# Patient Record
Sex: Female | Born: 1955 | Race: White | Hispanic: No | Marital: Single | Smoking: Former smoker
Health system: Southern US, Community
[De-identification: ages and names within clinical notes are randomized; demographics above are authoritative.]

## PROBLEM LIST (undated history)

## (undated) DIAGNOSIS — F329 Major depressive disorder, single episode, unspecified: Secondary | ICD-10-CM

## (undated) DIAGNOSIS — F419 Anxiety disorder, unspecified: Secondary | ICD-10-CM

## (undated) DIAGNOSIS — D649 Anemia, unspecified: Secondary | ICD-10-CM

## (undated) DIAGNOSIS — M47816 Spondylosis without myelopathy or radiculopathy, lumbar region: Secondary | ICD-10-CM

## (undated) DIAGNOSIS — G709 Myoneural disorder, unspecified: Secondary | ICD-10-CM

## (undated) DIAGNOSIS — E785 Hyperlipidemia, unspecified: Secondary | ICD-10-CM

## (undated) DIAGNOSIS — E7800 Pure hypercholesterolemia, unspecified: Secondary | ICD-10-CM

## (undated) DIAGNOSIS — I10 Essential (primary) hypertension: Secondary | ICD-10-CM

## (undated) DIAGNOSIS — F32A Depression, unspecified: Secondary | ICD-10-CM

## (undated) DIAGNOSIS — T7840XA Allergy, unspecified, initial encounter: Secondary | ICD-10-CM

## (undated) DIAGNOSIS — E119 Type 2 diabetes mellitus without complications: Secondary | ICD-10-CM

## (undated) HISTORY — DX: Anxiety disorder, unspecified: F41.9

## (undated) HISTORY — PX: BACK SURGERY: SHX140

## (undated) HISTORY — PX: TUBAL LIGATION: SHX77

## (undated) HISTORY — DX: Myoneural disorder, unspecified: G70.9

## (undated) HISTORY — DX: Anemia, unspecified: D64.9

## (undated) HISTORY — DX: Spondylosis without myelopathy or radiculopathy, lumbar region: M47.816

## (undated) HISTORY — DX: Hyperlipidemia, unspecified: E78.5

## (undated) HISTORY — DX: Allergy, unspecified, initial encounter: T78.40XA

## (undated) HISTORY — PX: HERNIA REPAIR: SHX51

---

## 1979-02-14 HISTORY — PX: OTHER SURGICAL HISTORY: SHX169

## 1979-02-14 HISTORY — PX: DILATION AND CURETTAGE OF UTERUS: SHX78

## 1985-02-13 HISTORY — PX: CHOLECYSTECTOMY: SHX55

## 1996-02-14 HISTORY — PX: BACK SURGERY: SHX140

## 1996-02-14 HISTORY — PX: SPINE SURGERY: SHX786

## 2000-06-10 ENCOUNTER — Emergency Department (HOSPITAL_COMMUNITY): Admission: EM | Admit: 2000-06-10 | Discharge: 2000-06-10 | Payer: Self-pay | Admitting: Internal Medicine

## 2000-06-10 ENCOUNTER — Encounter: Payer: Self-pay | Admitting: Internal Medicine

## 2000-10-19 ENCOUNTER — Encounter: Payer: Self-pay | Admitting: Family Medicine

## 2000-10-19 ENCOUNTER — Ambulatory Visit (HOSPITAL_COMMUNITY): Admission: RE | Admit: 2000-10-19 | Discharge: 2000-10-19 | Payer: Self-pay | Admitting: Family Medicine

## 2001-02-13 HISTORY — PX: HERNIA REPAIR: SHX51

## 2001-10-15 ENCOUNTER — Encounter: Admission: RE | Admit: 2001-10-15 | Discharge: 2002-01-13 | Payer: Self-pay | Admitting: Family Medicine

## 2003-02-14 HISTORY — PX: CHOLECYSTECTOMY: SHX55

## 2003-06-12 ENCOUNTER — Inpatient Hospital Stay (HOSPITAL_COMMUNITY): Admission: AD | Admit: 2003-06-12 | Discharge: 2003-06-17 | Payer: Self-pay | Admitting: Family Medicine

## 2003-06-12 ENCOUNTER — Ambulatory Visit (HOSPITAL_COMMUNITY): Admission: RE | Admit: 2003-06-12 | Discharge: 2003-06-12 | Payer: Self-pay | Admitting: Family Medicine

## 2003-11-08 ENCOUNTER — Emergency Department (HOSPITAL_COMMUNITY): Admission: EM | Admit: 2003-11-08 | Discharge: 2003-11-08 | Payer: Self-pay | Admitting: Emergency Medicine

## 2007-07-21 ENCOUNTER — Emergency Department (HOSPITAL_COMMUNITY): Admission: EM | Admit: 2007-07-21 | Discharge: 2007-07-21 | Payer: Self-pay | Admitting: Emergency Medicine

## 2010-03-05 ENCOUNTER — Encounter: Payer: Self-pay | Admitting: Family Medicine

## 2010-03-06 ENCOUNTER — Encounter: Payer: Self-pay | Admitting: Family Medicine

## 2010-07-01 NOTE — Op Note (Signed)
NAME:  Sabrina Thompson, Sabrina Thompson                          ACCOUNT NO.:  [id number]   MEDICAL RECORD NO.:  [id number]                   PATIENT TYPE:  INP   LOCATION:  A318                                 FACILITY:  [facility]   PHYSICIAN:  Dalia Heading, M.D.               DATE OF BIRTH:  [birth year]   DATE OF PROCEDURE:  DATE OF DISCHARGE:                                 OPERATIVE REPORT   PREOPERATIVE DIAGNOSIS:  Acute cholecystitis.   POSTOPERATIVE DIAGNOSIS:  Acute cholecystitis, cholelithiasis.   PROCEDURE:  Laparoscopic cholecystectomy.   SURGEON:  Dalia Heading, M.D.   ASSISTANT:  Bernerd Limbo. Leona Carry, M.D.   ANESTHESIA:  General endotracheal.   INDICATIONS FOR PROCEDURE:  The patient is a 55 year old white female who  presents with acute cholecystitis as diagnosed by CT scan of the abdomen and  pelvis.  The risks and benefits of the procedure, including bleeding,  infection, hepatobiliary injury, and the possibility of an open procedure  were fully explained to the patient who gave informed consent.   DESCRIPTION OF PROCEDURE:  The patient was placed in the supine position.  After induction of general endotracheal anesthesia, the abdomen was prepped  and draped using the usual sterile technique with Betadine.  Surgical site  confirmation was performed.   A supraumbilical incision was made down to the fascia.  A Veress needle was  introduced into the abdominal cavity, and confirmation of placement was done  using the saline drop test.  The abdomen was then insufflated to 16 mmHg  pressure.  An 11-mm trocar was introduced into the abdominal cavity under  direct visualization without difficulty.  An additional 11-mm trocar was  placed in the epigastric region and 5-mm trocars were placed in the left  upper quadrant, right upper quadrant, and right flank regions.  The liver  was inspected and noted to be within normal limits.  The gallbladder was  noted to be acutely inflamed and  swollen.  Needle decompression of the  gallbladder was performed.  The gallbladder was then retracted superiorly  and laterally.  The dissection was begun around the infundibulum of the  gallbladder.  The cystic duct was first identified.  Its juncture to the  infundibulum was fully identified.  Endoclips were placed proximally and  distally on the cystic duct, and the cystic duct was divided.  This was  likewise done on the cystic artery.  The gallbladder was then freed away  from the gallbladder fossa using Bovie electrocautery.  The gallbladder was  delivered through the epigastric trocar site using an EndoCatch bag.  The  epigastric trocar site did have to be extended to allow removal of the  gallbladder which was noted to have two large stones.  The gallbladder fossa  was inspected, and bleeding was controlled using Bovie electrocautery.  The  subhepatic space was then irrigated with normal saline.  All fluid and air  were then evacuated from the abdominal cavity prior to removal of the  trocars.  Of note was the fact that Surgicel was placed in the gallbladder  fossa.   All tape and needle counts were correct at the end of the procedure.  The  supraumbilical as well as epigastric fascia were reapproximated using an 0  Vicryl interrupted suture.  All skin incisions were closed using staples.  Betadine ointment and dry sterile dressings were applied.   The patient was extubated in the operating room and went back to the  recovery room awake and in stable condition.   COMPLICATIONS:  None.   SPECIMENS:  Gallbladder with stones.   ESTIMATED BLOOD LOSS:  Less than 100 cc.      ___________________________________________                                            Dalia Heading, M.D.   MAJ/MEDQ  D:  06/15/2003  T:  06/15/2003  Job:  [id number]   cc:   Kirk Ruths, M.D.  [address]  [place]  [place] [address]  Fax: [phone]

## 2010-07-01 NOTE — Discharge Summary (Signed)
NAME:  Sabrina Thompson, Sabrina Thompson NO.:  [id number]   MEDICAL RECORD NO.:  [id number]                   PATIENT TYPE:  INP   LOCATION:  A318                                 FACILITY:  [facility]   PHYSICIAN:  Dalia Heading, M.D.               DATE OF BIRTH:  13-May-1955   DATE OF ADMISSION:  06/12/2003  DATE OF DISCHARGE:  06/17/2003                                 DISCHARGE SUMMARY   HOSPITAL COURSE SUMMARY:  The patient is a 55 year old white female who  presented from Dr. Dionne Ano. McGough's office with right upper-quadrant  abdominal pain secondary to acute cholecystitis.  She was admitted to the  hospital for intravenous hydration, pain control, glucose control, and  intravenous antibiotics.  A surgical consultation was obtained.  The patient  was subsequently taken to the operating room and underwent laparoscopic  cholecystectomy on Jun 15, 2003.  She was found to have acute cholecystitis  secondary to cholelithiasis.  She tolerated the procedure well.  Her  postoperative course has been for the most part unremarkable.  Her white  blood cell count has returned as normal.  Her diet was advanced without  difficulty.   The patient is being discharged home on Jun 17, 2003, in good, improved  condition.   DISCHARGE INSTRUCTIONS:  The patient is to follow up with Dr. Dalia Heading on Jun 23, 2003.   DISCHARGE MEDICATIONS:  Tylenol or Motrin as needed for pain.   PRINCIPAL DIAGNOSES:  1. Acute cholecystitis, cholelithiasis.  2. Noninsulin dependent diabetes mellitus.  3. Remote history of hypertension.   PRINCIPAL PROCEDURE:  Laparoscopic cholecystectomy on Jun 15, 2003.     ___________________________________________                                         Dalia Heading, M.D.   MAJ/MEDQ  D:  06/17/2003  T:  06/18/2003  Job:  [id number]   cc:   Dalia Heading, M.D.  [address] [place]  [place]  [place] [address]  Fax: [phone]   Kirk Ruths,  M.D.  [address]  [place]  [place] [address]  Fax: [phone]

## 2010-07-01 NOTE — H&P (Signed)
NAME:  Sabrina Thompson, Sabrina Thompson NO.:  [id number]   MEDICAL RECORD NO.:  [id number]                   PATIENT TYPE:  INP   LOCATION:  A318                                 FACILITY:  [facility]   PHYSICIAN:  Kirk Ruths, M.D.            DATE OF BIRTH:  [birth year]   DATE OF ADMISSION:  06/12/2003  DATE OF DISCHARGE:                                HISTORY & PHYSICAL   CHIEF COMPLAINT:  Stomach pain with nausea and vomiting.   HISTORY OF PRESENT ILLNESS:  This is a 55 year old, obese, white female who  presents to the office with a three-day history of stomach feeling like it  was on fire with gas, belching, nausea, and vomiting.  She also states that  has not had a bowel movement in approximately a week.  Exam at the office  showed tender right upper and lower quadrants with hypoactive bowel sounds.  The patient appeared miserable.  She was treated with some IM Phenergan and  a stat CT of the abdomen and pelvis was ordered and showed acute  cholecystitis.  The patient was admitted for IV antibiotics, pain and nausea  control, and a surgical consult.   ALLERGIES:  She is allergic to PREDNISONE, although she can tolerate Depo-  Medrol.  ACE INHIBITORS give her cough.   MEDICATIONS:  Although she is not on any medicines at this time due to  financial restraints, her usual medications are:  1. Norvasc 10 mg daily.  2. Diovan 80 mg.  3. Glucovance 2.5/500 mg.   PAST MEDICAL HISTORY:  1. Hypertension.  2. Diabetes.   REVIEW OF SYSTEMS:  Denies chest pains.   PHYSICAL EXAMINATION:  GENERAL APPEARANCE:  An obese white female who  appears miserable.  VITAL SIGNS:  The temperature is 99.8 degrees.  She had had Tylenol  approximately three hours before.  The pulse is 74 and regular.  The blood  pressure is 160/90.  Respirations are 20 and unlabored.  HEENT:  TMs are normal.  Pupils equal and reactive to light and  accommodation.  Oropharynx benign.  NECK:  Supple  without JVD, bruit, or thyromegaly.  LUNGS:  Clear in all areas.  HEART:  Regular sinus rhythm without murmur, rub, or gallop.  ABDOMEN:  Pendulous with diminished bowel sounds.  She has tenderness in the  right upper and lower quadrants with suggestion rebound.  EXTREMITIES:  Without clubbing or cyanosis.  There is a trace of edema.  NEUROLOGIC:  Examination is grossly intact.   ASSESSMENT:  1. Acute cholecystitis.  2. Hypertension, noncompliant.  3. Type 2 diabetes, noncompliant.     ___________________________________________                                         Kirk Ruths, M.D.   WMM/MEDQ  D:  06/12/2003  T:  06/12/2003  Job:  [id number]

## 2010-07-01 NOTE — Consult Note (Signed)
NAME:  Sabrina Thompson, Sabrina Thompson                          ACCOUNT NO.:  [id number]   MEDICAL RECORD NO.:  [id number]                   PATIENT TYPE:  INP   LOCATION:  A318                                 FACILITY:  [facility]   PHYSICIAN:  Dalia Heading, M.D.               DATE OF BIRTH:  [birth year]   DATE OF CONSULTATION:  06/13/2003  DATE OF DISCHARGE:                                   CONSULTATION   REASON FOR CONSULTATION:  Acute cholecystitis.   HISTORY OF PRESENT ILLNESS:  This patient is a 55 year old white female who  presents with a several-day history of worsening right upper quadrant  abdominal pain.  She had a CT scan of the abdomen and pelvis done as an  outpatient which revealed acute cholecystitis.  She was admitted by Dr.  Regino Schultze for further evaluation and treatment.   PAST MEDICAL HISTORY:  Includes noninsulin-dependent diabetes mellitus for  which she has not been taking her medication.  There is also a remote  history of hypertension.   PAST SURGICAL HISTORY:  Back surgery, tubal ligation, D&C in the remote  past.   CURRENT MEDICATIONS:  She was taking none at the time of admission.   ALLERGIES:  Prednisone.   REVIEW OF SYSTEMS:  The patient does smoke.  She denies any recent  cardiopulmonary difficulties or bleeding disorders.   PHYSICAL EXAMINATION:  GENERAL:  The patient is a moderately overweight  white female in no acute distress.  VITAL SIGNS:  She is afebrile and vital  signs are stable.  ABDOMEN:  Soft with tenderness noted in the right upper quadrant to  palpation.  No hepatosplenomegaly, masses, or hernias are identified.   Liver profile is within normal limits.  White blood cell count was elevated  at 14,000.  MET-7 was remarkable for a blood sugar of 266, sodium 129.  BUN  and creatinine were within normal limits.   IMPRESSION:  1. Acute cholecystitis.  2. Uncontrolled diabetes mellitus.  3. Hypertension.  4. Hyponatremia.   PLAN:  The patient is to  continue her admission in the hospital for control  of her diabetes mellitus and for intravenous antibiotic therapy.  I agree  with Unasyn for control of her acute cholecystitis.  She has been  temporarily  scheduled for Monday, Jun 15, 2003, for laparoscopic cholecystectomy.  The  risks and benefits of the procedure, including bleeding, infection,  hepatobiliary injury, the possibility of an open procedure were fully  explained to the patient, she gave informed consent.      ___________________________________________                                            Dalia Heading, M.D.   MAJ/MEDQ  D:  06/13/2003  T:  06/13/2003  Job:  [id number]

## 2010-11-10 LAB — CBC
HCT: 39.4
Hemoglobin: 14
MCHC: 35.6
MCV: 85.7
RBC: 4.6

## 2010-11-10 LAB — BASIC METABOLIC PANEL
CO2: 27
Chloride: 101
Creatinine, Ser: 0.57
GFR calc Af Amer: >60
Potassium: 3.9

## 2010-11-10 LAB — DIFFERENTIAL
Basophils Relative: 1
Eosinophils Absolute: 0.1
Eosinophils Relative: 1
Monocytes Absolute: 0.4
Monocytes Relative: 5
Neutrophils Relative %: 71

## 2010-11-10 LAB — URINALYSIS, ROUTINE W REFLEX MICROSCOPIC
Bilirubin Urine: NEGATIVE
Glucose, UA: >1000 — AB
Hgb urine dipstick: NEGATIVE
Protein, ur: NEGATIVE
Urobilinogen, UA: 0.2

## 2011-09-18 ENCOUNTER — Encounter (HOSPITAL_COMMUNITY): Payer: Self-pay | Admitting: *Deleted

## 2011-09-18 ENCOUNTER — Emergency Department (HOSPITAL_COMMUNITY)
Admission: EM | Admit: 2011-09-18 | Discharge: 2011-09-18 | Disposition: A | Discharge status: Home / Self Care | Payer: PRIVATE HEALTH INSURANCE | Attending: Emergency Medicine | Admitting: Emergency Medicine

## 2011-09-18 ENCOUNTER — Emergency Department (HOSPITAL_COMMUNITY): Payer: PRIVATE HEALTH INSURANCE

## 2011-09-18 DIAGNOSIS — F3289 Other specified depressive episodes: Secondary | ICD-10-CM | POA: Insufficient documentation

## 2011-09-18 DIAGNOSIS — E119 Type 2 diabetes mellitus without complications: Secondary | ICD-10-CM | POA: Insufficient documentation

## 2011-09-18 DIAGNOSIS — I10 Essential (primary) hypertension: Secondary | ICD-10-CM | POA: Insufficient documentation

## 2011-09-18 DIAGNOSIS — K436 Other and unspecified ventral hernia with obstruction, without gangrene: Secondary | ICD-10-CM | POA: Insufficient documentation

## 2011-09-18 DIAGNOSIS — R1033 Periumbilical pain: Secondary | ICD-10-CM | POA: Insufficient documentation

## 2011-09-18 DIAGNOSIS — Z79899 Other long term (current) drug therapy: Secondary | ICD-10-CM | POA: Insufficient documentation

## 2011-09-18 DIAGNOSIS — F329 Major depressive disorder, single episode, unspecified: Secondary | ICD-10-CM | POA: Insufficient documentation

## 2011-09-18 HISTORY — DX: Depression, unspecified: F32.A

## 2011-09-18 HISTORY — DX: Essential (primary) hypertension: I10

## 2011-09-18 HISTORY — DX: Major depressive disorder, single episode, unspecified: F32.9

## 2011-09-18 LAB — CBC WITH DIFFERENTIAL/PLATELET
Eosinophils Absolute: 0 10*3/uL (ref 0.0–0.7)
Eosinophils Relative: 0 % (ref 0–5)
HCT: 37.2 % (ref 36.0–46.0)
Lymphocytes Relative: 22 % (ref 12–46)
Lymphs Abs: 1.6 10*3/uL (ref 0.7–4.0)
MCH: 30.3 pg (ref 26.0–34.0)
MCV: 88.8 fL (ref 78.0–100.0)
Monocytes Absolute: 0.3 10*3/uL (ref 0.1–1.0)
Monocytes Relative: 5 % (ref 3–12)
Platelets: 303 10*3/uL (ref 150–400)
RBC: 4.19 MIL/uL (ref 3.87–5.11)
WBC: 7 10*3/uL (ref 4.0–10.5)

## 2011-09-18 LAB — COMPREHENSIVE METABOLIC PANEL
ALT: 28 U/L (ref 0–35)
Albumin: 3.8 g/dL (ref 3.5–5.2)
Alkaline Phosphatase: 68 U/L (ref 39–117)
BUN: 11 mg/dL (ref 6–23)
Chloride: 96 mEq/L (ref 96–112)
GFR calc Af Amer: >90 mL/min (ref >90)
Glucose, Bld: 176 mg/dL — ABNORMAL HIGH (ref 70–99)
Potassium: 3.6 mEq/L (ref 3.5–5.1)
Sodium: 134 mEq/L — ABNORMAL LOW (ref 135–145)
Total Bilirubin: 0.3 mg/dL (ref 0.3–1.2)

## 2011-09-18 LAB — LIPASE, BLOOD: Lipase: 22 U/L (ref 11–59)

## 2011-09-18 LAB — URINALYSIS, ROUTINE W REFLEX MICROSCOPIC
Bilirubin Urine: NEGATIVE
Hgb urine dipstick: NEGATIVE
Ketones, ur: NEGATIVE mg/dL
Nitrite: NEGATIVE
pH: 6 (ref 5.0–8.0)

## 2011-09-18 MED ORDER — ONDANSETRON HCL 4 MG/2ML IJ SOLN
4.0000 mg | Freq: Once | INTRAMUSCULAR | Status: AC
  Administered 2011-09-18: 4 mg via INTRAVENOUS
  Filled 2011-09-18 (×2): qty 2

## 2011-09-18 MED ORDER — HYDROMORPHONE HCL PF 1 MG/ML IJ SOLN
1.0000 mg | Freq: Once | INTRAMUSCULAR | Status: AC
  Administered 2011-09-18: 1 mg via INTRAVENOUS
  Filled 2011-09-18 (×2): qty 1

## 2011-09-18 MED ORDER — SODIUM CHLORIDE 0.9 % IV SOLN
1000.0000 mL | INTRAVENOUS | Status: DC
  Administered 2011-09-18: 1000 mL via INTRAVENOUS

## 2011-09-18 MED ORDER — IOHEXOL 300 MG/ML  SOLN
100.0000 mL | Freq: Once | INTRAMUSCULAR | Status: AC | PRN
  Administered 2011-09-18: 100 mL via INTRAVENOUS

## 2011-09-18 NOTE — ED Provider Notes (Signed)
The patient has been seen by Dr. Lovell Sheehan, her surgeon. He reduced her return in emergency department. Patient had no further complaints and no further needs. She is to followup with Dr. Lovell Sheehan, when necessary; but probably within next week, to arrange for surgical repair.  Flint Melter, MD 09/18/11 [phone]

## 2011-09-18 NOTE — ED Provider Notes (Cosign Needed)
History     CSN: [id number]  Arrival date & time 09/18/11  1249   First MD Initiated Contact with Patient 09/18/11 1319      Chief Complaint  Patient presents with  . Abdominal Pain    (Consider location/radiation/quality/duration/timing/severity/associated sxs/prior treatment) HPI Comments: The patient is a 56 year old woman who says her stomach feels raw inside and it will knot up. This chart at 3 AM today. She thinks is because she ate spicy food yesterday. There's been no nausea or vomiting, no diarrhea. She denies difficulty with urination. She said some relief with belching. She is menopausal. She tried Pepto-Bismol without relief. He has noted a lump above her umbilicus from time to time, which often will disappear after resting all night.  Patient is a 56 y.o. female presenting with abdominal pain. The history is provided by the patient.  Abdominal Pain The primary symptoms of the illness include abdominal pain. The primary symptoms of the illness do not include fever, nausea or vomiting. The current episode started 13 to 24 hours ago. The onset of the illness was gradual. The problem has been gradually worsening.  Associated with: Eating spicy foods last night. The patient states that she believes she is currently not pregnant. The patient has not had a change in bowel habit. Symptoms associated with the illness do not include chills, heartburn, constipation, hematuria, frequency or back pain. Significant associated medical issues include diabetes.    Past Medical History  Diagnosis Date  . Diabetes mellitus   . Hypertension   . Depression     Past Surgical History  Procedure Date  . Cholecystectomy   . Back surgery     History reviewed. No pertinent family history.  History  Substance Use Topics  . Smoking status: Former Games developer  . Smokeless tobacco: Not on file  . Alcohol Use: No    OB History    Grav Para Term Preterm Abortions TAB SAB Ect Mult Living           Review of Systems  Constitutional: Negative.  Negative for fever and chills.  HENT: Negative.   Eyes: Negative.   Respiratory: Negative.   Cardiovascular: Negative.   Gastrointestinal: Positive for abdominal pain. Negative for heartburn, nausea, vomiting and constipation.  Genitourinary: Negative for frequency and hematuria.  Musculoskeletal: Negative for back pain.  Skin: Negative.   Neurological: Negative.   Psychiatric/Behavioral: Negative.     Allergies  Prednisone  Home Medications   Current Outpatient Rx  Name Route Sig Dispense Refill  . BISMUTH SUBSALICYLATE 262 MG PO CHEW Oral Chew 524 mg by mouth as needed. For nausea    . DESVENLAFAXINE SUCCINATE ER 50 MG PO TB24 Oral Take 50 mg by mouth at bedtime.    . OMEGA-3 FATTY ACIDS 1000 MG PO CAPS Oral Take 1 g by mouth 2 (two) times daily.    Marland Kitchen GLIPIZIDE 5 MG PO TABS Oral Take 5 mg by mouth 2 (two) times daily.    Marland Kitchen LISINOPRIL-HYDROCHLOROTHIAZIDE 20-25 MG PO TABS Oral Take 1 tablet by mouth daily.    Marland Kitchen LORAZEPAM 0.5 MG PO TABS Oral Take 0.5 mg by mouth at bedtime.    Marland Kitchen METFORMIN HCL 1000 MG PO TABS Oral Take 1,000 mg by mouth 2 (two) times daily with a meal.      BP 157/92  Pulse 81  Temp 98.3 F (36.8 C) (Oral)  Resp 17  Ht 5\' 6"  (1.676 m)  Wt 207 lb (93.895 kg)  BMI 33.41  kg/m2  SpO2 100%  Physical Exam  Nursing note and vitals reviewed. Constitutional: She is oriented to person, place, and time. She appears well-developed and well-nourished. No distress.  HENT:  Head: Normocephalic and atraumatic.  Right Ear: External ear normal.  Left Ear: External ear normal.  Mouth/Throat: Oropharynx is clear and moist.  Eyes: Conjunctivae and EOM are normal. Pupils are equal, round, and reactive to light. No scleral icterus.  Neck: Normal range of motion. Neck supple.  Cardiovascular: Normal rate, regular rhythm and normal heart sounds.   Pulmonary/Chest: Effort normal and breath sounds normal.  Abdominal:  Soft. She exhibits no distension. There is tenderness. There is guarding. There is no rebound.       She has a 2 cm diameter incarcerated hernia in the epigastrium just above her umbilicus in the midline.  This hernia is tender and will not reduce.  Musculoskeletal: Normal range of motion. She exhibits no edema and no tenderness.  Neurological: She is alert and oriented to person, place, and time.       No sensory or motor deficit.  Skin: Skin is warm and dry.  Psychiatric: She has a normal mood and affect. Her behavior is normal.    ED Course  Procedures (including critical care time)   Labs Reviewed  COMPREHENSIVE METABOLIC PANEL  LIPASE, BLOOD  URINALYSIS, ROUTINE W REFLEX MICROSCOPIC  PREGNANCY, URINE  CBC WITH DIFFERENTIAL   Ct Abdomen Pelvis W Contrast  09/18/2011  *RADIOLOGY REPORT*  Clinical Data: Periumbilical pain.  CT ABDOMEN AND PELVIS WITH CONTRAST  Technique:  Multidetector CT imaging of the abdomen and pelvis was performed following the standard protocol during bolus administration of intravenous contrast.  Contrast: OMNIPAQUE IOHEXOL 300 MG/ML  SOLN  Comparison: 06/12/2003  Findings: Lung bases are clear.  No effusions.  Heart is normal size.  Prior cholecystectomy.  Liver, spleen, pancreas, adrenals and kidneys are normal.  No biliary ductal dilatation.  No hydronephrosis.  There is an umbilical hernia which contains a small bowel loop. Small bowel loops proximal to this are mildly dilated with decompressed distal small bowel.  Findings suspicious for early bowel obstruction. Colon is decompressed.  Calcifications in the uterus compatible with fibroids.  No adnexal masses.  No free fluid, free air or adenopathy.  Scattered sigmoid diverticula.  No active diverticulitis.  No acute bony abnormality.  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  IMPRESSION: Umbilical hernia containing small bowel loop.  Associated early small bowel obstruction.  Sigmoid diverticulosis.   Original Report Authenticated By: Cyndie Chime, M.D.   Lab workup showed an incarcerated epigastric hernia.  Call to Franky Macho, M.D., general surgeon on call, who will be in to see pt.   1. Incarcerated epigastric hernia         Carleene Cooper III, MD 09/18/11 [phone]

## 2011-09-18 NOTE — ED Notes (Signed)
Pt c/o abdominal cramping and sharp/burning in mid right abdomin. Pt states had eaten a lot of spicy food yesterday, and feels better after "belching:".   Pt is alert and oriented x 4, side rails up per safety.

## 2011-09-18 NOTE — ED Notes (Signed)
Pt up to bathroom. Walked without assist. States abd feels better

## 2011-09-18 NOTE — ED Notes (Signed)
Patient is comfortable at this time. 

## 2011-09-18 NOTE — Consult Note (Signed)
Reason for Consult: Partial small bowel obstruction, periumbilical hernia Referring Physician: ER  Sabrina Thompson is an 56 y.o. female.  HPI: Patient is Thompson 56 year old white female who presents with Thompson three-week history of intermittent swelling just above her umbilicus. Today, it started hurting more along with swelling which did not go away on its own. CT scan the abdomen pelvis reveals Thompson supraumbilical hernia with small bowel contents. She is status post laparoscopic cholecystectomy in 2005. She denies any fever, chills. She did have an episode of emesis today. She does have nausea.  Past Medical History  Diagnosis Date  . Diabetes mellitus   . Hypertension   . Depression     Past Surgical History  Procedure Date  . Cholecystectomy   . Back surgery     History reviewed. No pertinent family history.  Social History:  reports that she has quit smoking. She does not have any smokeless tobacco history on file. She reports that she does not drink alcohol or use illicit drugs.  Allergies:  Allergies  Allergen Reactions  . Prednisone Hives    Also causes confusion    Medications: I have reviewed the patient's current medications.  Results for orders placed during the hospital encounter of 09/18/11 (from the past 48 hour(s))  COMPREHENSIVE METABOLIC PANEL     Status: Abnormal   Collection Time   09/18/11  2:13 PM      Component Value Range Comment   Sodium 134 (*) 135 - 145 mEq/L    Potassium 3.6  3.5 - 5.1 mEq/L    Chloride 96  96 - 112 mEq/L    CO2 28  19 - 32 mEq/L    Glucose, Bld 176 (*) 70 - 99 mg/dL    BUN 11  6 - 23 mg/dL    Creatinine, Ser 1.61  0.50 - 1.10 mg/dL    Calcium 09.6  8.4 - 10.5 mg/dL    Total Protein 7.1  6.0 - 8.3 g/dL    Albumin 3.8  3.5 - 5.2 g/dL    AST 19  0 - 37 U/L    ALT 28  0 - 35 U/L    Alkaline Phosphatase 68  39 - 117 U/L    Total Bilirubin 0.3  0.3 - 1.2 mg/dL    GFR calc non Af Amer >90  >90 mL/min    GFR calc Af Amer >90  >90 mL/min     LIPASE, BLOOD     Status: Normal   Collection Time   09/18/11  2:13 PM      Component Value Range Comment   Lipase 22  11 - 59 U/L   CBC WITH DIFFERENTIAL     Status: Normal   Collection Time   09/18/11  2:13 PM      Component Value Range Comment   WBC 7.0  4.0 - 10.5 K/uL    RBC 4.19  3.87 - 5.11 MIL/uL    Hemoglobin 12.7  12.0 - 15.0 g/dL    HCT 04.5  40.9 - 81.1 %    MCV 88.8  78.0 - 100.0 fL    MCH 30.3  26.0 - 34.0 pg    MCHC 34.1  30.0 - 36.0 g/dL    RDW 91.4  78.2 - 95.6 %    Platelets 303  150 - 400 K/uL    Neutrophils Relative 72  43 - 77 %    Neutro Abs 5.1  1.7 - 7.7 K/uL    Lymphocytes  Relative 22  12 - 46 %    Lymphs Abs 1.6  0.7 - 4.0 K/uL    Monocytes Relative 5  3 - 12 %    Monocytes Absolute 0.3  0.1 - 1.0 K/uL    Eosinophils Relative 0  0 - 5 %    Eosinophils Absolute 0.0  0.0 - 0.7 K/uL    Basophils Relative 0  0 - 1 %    Basophils Absolute 0.0  0.0 - 0.1 K/uL   URINALYSIS, ROUTINE W REFLEX MICROSCOPIC     Status: Normal   Collection Time   09/18/11  2:14 PM      Component Value Range Comment   Color, Urine YELLOW  YELLOW    APPearance CLEAR  CLEAR    Specific Gravity, Urine 1.025  1.005 - 1.030    pH 6.0  5.0 - 8.0    Glucose, UA NEGATIVE  NEGATIVE mg/dL    Hgb urine dipstick NEGATIVE  NEGATIVE    Bilirubin Urine NEGATIVE  NEGATIVE    Ketones, ur NEGATIVE  NEGATIVE mg/dL    Protein, ur NEGATIVE  NEGATIVE mg/dL    Urobilinogen, UA 0.2  0.0 - 1.0 mg/dL    Nitrite NEGATIVE  NEGATIVE    Leukocytes, UA NEGATIVE  NEGATIVE MICROSCOPIC NOT DONE ON URINES WITH NEGATIVE PROTEIN, BLOOD, LEUKOCYTES, NITRITE, OR GLUCOSE <1000 mg/dL.  PREGNANCY, URINE     Status: Normal   Collection Time   09/18/11  2:14 PM      Component Value Range Comment   Preg Test, Ur NEGATIVE  NEGATIVE     Ct Abdomen Pelvis W Contrast  09/18/2011  *RADIOLOGY REPORT*  Clinical Data: Periumbilical pain.  CT ABDOMEN AND PELVIS WITH CONTRAST  Technique:  Multidetector CT imaging of the abdomen  and pelvis was performed following the standard protocol during bolus administration of intravenous contrast.  Contrast: OMNIPAQUE IOHEXOL 300 MG/ML  SOLN  Comparison: 06/12/2003  Findings: Lung bases are clear.  No effusions.  Heart is normal size.  Prior cholecystectomy.  Liver, spleen, pancreas, adrenals and kidneys are normal.  No biliary ductal dilatation.  No hydronephrosis.  There is an umbilical hernia which contains Thompson small bowel loop. Small bowel loops proximal to this are mildly dilated with decompressed distal small bowel.  Findings suspicious for early bowel obstruction. Colon is decompressed.  Calcifications in the uterus compatible with fibroids.  No adnexal masses.  No free fluid, free air or adenopathy.  Scattered sigmoid diverticula.  No active diverticulitis.  No acute bony abnormality.  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  IMPRESSION: Umbilical hernia containing small bowel loop.  Associated early small bowel obstruction.  Sigmoid diverticulosis.  Original Report Authenticated By: Cyndie Chime, M.D.    ROS: See chart Blood pressure 139/76, pulse 81, temperature 98.3 F (36.8 C), temperature source Oral, resp. rate 18, height 5\' 6"  (1.676 m), weight 93.895 kg (207 lb), SpO2 100.00%. Physical Exam: Well-developed well-nourished white female in no acute distress. Abdomen: Soft. Thompson supraumbilical hernia is present below Thompson surgical scar. This was reduced at bedside without difficulty. No rigidity noted. No hepatosplenomegaly.  Assessment/Plan: Impression: Incisional hernia, reduced. Plan: Patient would not like surgical intervention at this time, which is fine with me. The risk of incarceration and how to treat that was explained to the patient. She will followup in my office for surgical intervention. I did encourage her to have this repaired in the not-too-distant future. She understands and agrees.  Sabrina Thompson  09/18/2011, 4:24 PM

## 2011-09-18 NOTE — ED Notes (Signed)
abd pain , onset this am,  No NVD, " feels raw on the inside"  No urinary sx

## 2011-09-19 NOTE — H&P (Signed)
Sabrina Thompson is an 56 y.o. female.   Chief Complaint: incisional hernia HPI: 56yo wf who presents with a several week h/o supraumbilical pain and swelling.  Seen in ER, found to have a reducible incisional hernia.  Past Medical History  Diagnosis Date  . Diabetes mellitus   . Hypertension   . Depression     Past Surgical History  Procedure Date  . Cholecystectomy   . Back surgery     No family history on file. Social History:  reports that she has quit smoking. She does not have any smokeless tobacco history on file. She reports that she does not drink alcohol or use illicit drugs.  Allergies:  Allergies  Allergen Reactions  . Prednisone Hives    Also causes confusion    No prescriptions prior to admission    Results for orders placed during the hospital encounter of 09/18/11 (from the past 48 hour(s))  COMPREHENSIVE METABOLIC PANEL     Status: Abnormal   Collection Time   09/18/11  2:13 PM      Component Value Range Comment   Sodium 134 (*) 135 - 145 mEq/L    Potassium 3.6  3.5 - 5.1 mEq/L    Chloride 96  96 - 112 mEq/L    CO2 28  19 - 32 mEq/L    Glucose, Bld 176 (*) 70 - 99 mg/dL    BUN 11  6 - 23 mg/dL    Creatinine, Ser 1.19  0.50 - 1.10 mg/dL    Calcium 14.7  8.4 - 10.5 mg/dL    Total Protein 7.1  6.0 - 8.3 g/dL    Albumin 3.8  3.5 - 5.2 g/dL    AST 19  0 - 37 U/L    ALT 28  0 - 35 U/L    Alkaline Phosphatase 68  39 - 117 U/L    Total Bilirubin 0.3  0.3 - 1.2 mg/dL    GFR calc non Af Amer >90  >90 mL/min    GFR calc Af Amer >90  >90 mL/min   LIPASE, BLOOD     Status: Normal   Collection Time   09/18/11  2:13 PM      Component Value Range Comment   Lipase 22  11 - 59 U/L   CBC WITH DIFFERENTIAL     Status: Normal   Collection Time   09/18/11  2:13 PM      Component Value Range Comment   WBC 7.0  4.0 - 10.5 K/uL    RBC 4.19  3.87 - 5.11 MIL/uL    Hemoglobin 12.7  12.0 - 15.0 g/dL    HCT 82.9  56.2 - 13.0 %    MCV 88.8  78.0 - 100.0 fL    MCH 30.3  26.0  - 34.0 pg    MCHC 34.1  30.0 - 36.0 g/dL    RDW 86.5  78.4 - 69.6 %    Platelets 303  150 - 400 K/uL    Neutrophils Relative 72  43 - 77 %    Neutro Abs 5.1  1.7 - 7.7 K/uL    Lymphocytes Relative 22  12 - 46 %    Lymphs Abs 1.6  0.7 - 4.0 K/uL    Monocytes Relative 5  3 - 12 %    Monocytes Absolute 0.3  0.1 - 1.0 K/uL    Eosinophils Relative 0  0 - 5 %    Eosinophils Absolute 0.0  0.0 - 0.7  K/uL    Basophils Relative 0  0 - 1 %    Basophils Absolute 0.0  0.0 - 0.1 K/uL   URINALYSIS, ROUTINE W REFLEX MICROSCOPIC     Status: Normal   Collection Time   09/18/11  2:14 PM      Component Value Range Comment   Color, Urine YELLOW  YELLOW    APPearance CLEAR  CLEAR    Specific Gravity, Urine 1.025  1.005 - 1.030    pH 6.0  5.0 - 8.0    Glucose, UA NEGATIVE  NEGATIVE mg/dL    Hgb urine dipstick NEGATIVE  NEGATIVE    Bilirubin Urine NEGATIVE  NEGATIVE    Ketones, ur NEGATIVE  NEGATIVE mg/dL    Protein, ur NEGATIVE  NEGATIVE mg/dL    Urobilinogen, UA 0.2  0.0 - 1.0 mg/dL    Nitrite NEGATIVE  NEGATIVE    Leukocytes, UA NEGATIVE  NEGATIVE MICROSCOPIC NOT DONE ON URINES WITH NEGATIVE PROTEIN, BLOOD, LEUKOCYTES, NITRITE, OR GLUCOSE <1000 mg/dL.  PREGNANCY, URINE     Status: Normal   Collection Time   09/18/11  2:14 PM      Component Value Range Comment   Preg Test, Ur NEGATIVE  NEGATIVE    Ct Abdomen Pelvis W Contrast  09/18/2011  *RADIOLOGY REPORT*  Clinical Data: Periumbilical pain.  CT ABDOMEN AND PELVIS WITH CONTRAST  Technique:  Multidetector CT imaging of the abdomen and pelvis was performed following the standard protocol during bolus administration of intravenous contrast.  Contrast: OMNIPAQUE IOHEXOL 300 MG/ML  SOLN  Comparison: 06/12/2003  Findings: Lung bases are clear.  No effusions.  Heart is normal size.  Prior cholecystectomy.  Liver, spleen, pancreas, adrenals and kidneys are normal.  No biliary ductal dilatation.  No hydronephrosis.  There is an umbilical hernia which  contains a small bowel loop. Small bowel loops proximal to this are mildly dilated with decompressed distal small bowel.  Findings suspicious for early bowel obstruction. Colon is decompressed.  Calcifications in the uterus compatible with fibroids.  No adnexal masses.  No free fluid, free air or adenopathy.  Scattered sigmoid diverticula.  No active diverticulitis.  No acute bony abnormality.  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  IMPRESSION: Umbilical hernia containing small bowel loop.  Associated early small bowel obstruction.  Sigmoid diverticulosis.  Original Report Authenticated By: Cyndie Chime, M.D.    Review of Systems  Constitutional: Negative.   HENT: Negative.   Eyes: Negative.   Respiratory: Negative.   Cardiovascular: Negative.   Gastrointestinal: Positive for abdominal pain.  Genitourinary: Negative.   Musculoskeletal: Negative.   Skin: Negative.   Neurological: Negative.   Endo/Heme/Allergies: Negative.   Psychiatric/Behavioral: Negative.     There were no vitals taken for this visit. Physical Exam  Constitutional: She is oriented to person, place, and time. She appears well-developed and well-nourished.  HENT:  Head: Normocephalic.  Neck: Normal range of motion. Neck supple.  Cardiovascular: Normal rate, regular rhythm and normal heart sounds.   Respiratory: Effort normal and breath sounds normal.  GI: Soft. She exhibits no distension. There is no tenderness.       Reducible supraumbilical incisional hernia.  Neurological: She is alert and oriented to person, place, and time.  Skin: Skin is warm and dry.     Assessment/Plan Imp:  Incisional hernia Plan:  Scheduled for incisional herniorrhaphy with mesh on 09/22/11.  Risks and benefits of procedure were fully explained to the patient, who gives informed consent.  Tasnia Spegal A 09/19/2011, 8:31 PM

## 2011-09-20 ENCOUNTER — Encounter (HOSPITAL_COMMUNITY)
Admission: RE | Admit: 2011-09-20 | Discharge: 2011-09-20 | Disposition: A | Discharge status: Home / Self Care | Payer: PRIVATE HEALTH INSURANCE | Source: Ambulatory Visit | Attending: General Surgery | Admitting: General Surgery

## 2011-09-20 ENCOUNTER — Encounter (HOSPITAL_COMMUNITY): Payer: Self-pay

## 2011-09-20 ENCOUNTER — Encounter (HOSPITAL_COMMUNITY): Payer: Self-pay | Admitting: Pharmacy Technician

## 2011-09-20 ENCOUNTER — Other Ambulatory Visit: Payer: Self-pay

## 2011-09-20 NOTE — Patient Instructions (Addendum)
20 Sabrina Thompson  09/20/2011   Your procedure is scheduled on:  09/22/2011  Report to [facility] at  820  AM.  Call this number if you have problems the morning of surgery: [phone]   Remember:   Do not eat food:After Midnight.  May have clear liquids:until Midnight .    Take these medicines the morning of surgery with A SIP OF WATER: pristiq, lisinopril   Do not wear jewelry, make-up or nail polish.  Do not wear lotions, powders, or perfumes. You may wear deodorant.  Do not shave 48 hours prior to surgery. Men may shave face and neck.  Do not bring valuables to the hospital.  Contacts, dentures or bridgework may not be worn into surgery.  Leave suitcase in the car. After surgery it may be brought to your room.  For patients admitted to the hospital, checkout time is 11:00 AM the day of discharge.   Patients discharged the day of surgery will not be allowed to drive home.  Name and phone number of your driver: family  Special Instructions: CHG Shower Use Special Wash: 1/2 bottle night before surgery and 1/2 bottle morning of surgery.   Please read over the following fact sheets that you were given: Pain Booklet, MRSA Information, Surgical Site Infection Prevention, Anesthesia Post-op Instructions and Care and Recovery After Surgery PATIENT INSTRUCTIONS POST-ANESTHESIA  IMMEDIATELY FOLLOWING SURGERY:  Do not drive or operate machinery for the first twenty four hours after surgery.  Do not make any important decisions for twenty four hours after surgery or while taking narcotic pain medications or sedatives.  If you develop intractable nausea and vomiting or a severe headache please notify your doctor immediately.  FOLLOW-UP:  Please make an appointment with your surgeon as instructed. You do not need to follow up with anesthesia unless specifically instructed to do so.  WOUND CARE INSTRUCTIONS (if applicable):  Keep a dry clean dressing on the anesthesia/puncture wound site if  there is drainage.  Once the wound has quit draining you may leave it open to air.  Generally you should leave the bandage intact for twenty four hours unless there is drainage.  If the epidural site drains for more than 36-48 hours please call the anesthesia department.  QUESTIONS?:  Please feel free to call your physician or the hospital operator if you have any questions, and they will be happy to assist you.      Hernia A hernia occurs when an internal organ pushes out through a weak spot in the abdominal wall. Hernias most commonly occur in the groin and around the navel. Hernias often can be pushed back into place (reduced). Most hernias tend to get worse over time. Some abdominal hernias can get stuck in the opening (irreducible or incarcerated hernia) and cannot be reduced. An irreducible abdominal hernia which is tightly squeezed into the opening is at risk for impaired blood supply (strangulated hernia). A strangulated hernia is a medical emergency. Because of the risk for an irreducible or strangulated hernia, surgery may be recommended to repair a hernia. CAUSES   Heavy lifting.   Prolonged coughing.   Straining to have a bowel movement.   A cut (incision) made during an abdominal surgery.  HOME CARE INSTRUCTIONS   Bed rest is not required. You may continue your normal activities.   Avoid lifting more than 10 pounds (4.5 kg) or straining.   Cough gently. If you are a smoker it is best to stop.  Even the best hernia repair can break down with the continual strain of coughing. Even if you do not have your hernia repaired, a cough will continue to aggravate the problem.   Do not wear anything tight over your hernia. Do not try to keep it in with an outside bandage or truss. These can damage abdominal contents if they are trapped within the hernia sac.   Eat a normal diet.   Avoid constipation. Straining over long periods of time will increase hernia size and encourage breakdown of  repairs. If you cannot do this with diet alone, stool softeners may be used.  SEEK IMMEDIATE MEDICAL CARE IF:   You have a fever.   You develop increasing abdominal pain.   You feel nauseous or vomit.   Your hernia is stuck outside the abdomen, looks discolored, feels hard, or is tender.   You have any changes in your bowel habits or in the hernia that are unusual for you.   You have increased pain or swelling around the hernia.   You cannot push the hernia back in place by applying gentle pressure while lying down.  MAKE SURE YOU:   Understand these instructions.   Will watch your condition.   Will get help right away if you are not doing well or get worse.  Document Released: 01/30/2005 Document Revised: 01/19/2011 Document Reviewed: 09/19/2007 [facility] Patient Information 2012 [place], [place].

## 2011-09-22 ENCOUNTER — Encounter (HOSPITAL_COMMUNITY): Payer: Self-pay | Admitting: Anesthesiology

## 2011-09-22 ENCOUNTER — Encounter (HOSPITAL_COMMUNITY): Payer: Self-pay | Admitting: *Deleted

## 2011-09-22 ENCOUNTER — Encounter (HOSPITAL_COMMUNITY)
Admission: RE | Disposition: A | Discharge status: Home / Self Care | Payer: Self-pay | Source: Ambulatory Visit | Attending: General Surgery

## 2011-09-22 ENCOUNTER — Ambulatory Visit (HOSPITAL_COMMUNITY)
Admission: RE | Admit: 2011-09-22 | Discharge: 2011-09-22 | Disposition: A | Discharge status: Home / Self Care | Payer: PRIVATE HEALTH INSURANCE | Source: Ambulatory Visit | Attending: General Surgery | Admitting: General Surgery

## 2011-09-22 ENCOUNTER — Ambulatory Visit (HOSPITAL_COMMUNITY): Payer: PRIVATE HEALTH INSURANCE | Admitting: Anesthesiology

## 2011-09-22 DIAGNOSIS — I10 Essential (primary) hypertension: Secondary | ICD-10-CM | POA: Insufficient documentation

## 2011-09-22 DIAGNOSIS — Z01812 Encounter for preprocedural laboratory examination: Secondary | ICD-10-CM | POA: Insufficient documentation

## 2011-09-22 DIAGNOSIS — Z01810 Encounter for preprocedural cardiovascular examination: Secondary | ICD-10-CM | POA: Insufficient documentation

## 2011-09-22 DIAGNOSIS — E119 Type 2 diabetes mellitus without complications: Secondary | ICD-10-CM | POA: Insufficient documentation

## 2011-09-22 DIAGNOSIS — Z79899 Other long term (current) drug therapy: Secondary | ICD-10-CM | POA: Insufficient documentation

## 2011-09-22 DIAGNOSIS — K432 Incisional hernia without obstruction or gangrene: Secondary | ICD-10-CM | POA: Insufficient documentation

## 2011-09-22 HISTORY — PX: INCISIONAL HERNIA REPAIR: SHX193

## 2011-09-22 LAB — GLUCOSE, CAPILLARY: Glucose-Capillary: 158 mg/dL — ABNORMAL HIGH (ref 70–99)

## 2011-09-22 SURGERY — REPAIR, HERNIA, INCISIONAL
Anesthesia: General | Site: Abdomen | Wound class: Clean

## 2011-09-22 MED ORDER — FENTANYL CITRATE 0.05 MG/ML IJ SOLN
25.0000 ug | INTRAMUSCULAR | Status: DC | PRN
  Administered 2011-09-22 (×2): 50 ug via INTRAVENOUS

## 2011-09-22 MED ORDER — FENTANYL CITRATE 0.05 MG/ML IJ SOLN
INTRAMUSCULAR | Status: AC
  Filled 2011-09-22: qty 2

## 2011-09-22 MED ORDER — CHLORHEXIDINE GLUCONATE 4 % EX LIQD: 1.0000 "application " | Freq: Once | CUTANEOUS | Status: DC

## 2011-09-22 MED ORDER — MIDAZOLAM HCL 2 MG/2ML IJ SOLN
1.0000 mg | INTRAMUSCULAR | Status: DC | PRN
  Administered 2011-09-22 (×2): 2 mg via INTRAVENOUS

## 2011-09-22 MED ORDER — ENOXAPARIN SODIUM 40 MG/0.4ML ~~LOC~~ SOLN
SUBCUTANEOUS | Status: AC
  Filled 2011-09-22: qty 0.4

## 2011-09-22 MED ORDER — POVIDONE-IODINE 10 % EX OINT
TOPICAL_OINTMENT | CUTANEOUS | Status: AC
  Filled 2011-09-22: qty 1

## 2011-09-22 MED ORDER — KETOROLAC TROMETHAMINE 30 MG/ML IJ SOLN
INTRAMUSCULAR | Status: AC
  Filled 2011-09-22: qty 1

## 2011-09-22 MED ORDER — SODIUM CHLORIDE 0.9 % IR SOLN
Status: DC | PRN
  Administered 2011-09-22: 1000 mL

## 2011-09-22 MED ORDER — CEFAZOLIN SODIUM-DEXTROSE 2-3 GM-% IV SOLR
2.0000 g | INTRAVENOUS | Status: AC
  Administered 2011-09-22: 2 g via INTRAVENOUS

## 2011-09-22 MED ORDER — HYDROCODONE-ACETAMINOPHEN 5-325 MG PO TABS: 1.0000 | ORAL_TABLET | ORAL | Status: AC | PRN

## 2011-09-22 MED ORDER — KETOROLAC TROMETHAMINE 30 MG/ML IJ SOLN
30.0000 mg | Freq: Once | INTRAMUSCULAR | Status: AC
  Administered 2011-09-22: 30 mg via INTRAVENOUS

## 2011-09-22 MED ORDER — ONDANSETRON HCL 4 MG/2ML IJ SOLN
4.0000 mg | Freq: Once | INTRAMUSCULAR | Status: AC
  Administered 2011-09-22: 4 mg via INTRAVENOUS

## 2011-09-22 MED ORDER — POVIDONE-IODINE 10 % OINT PACKET
TOPICAL_OINTMENT | CUTANEOUS | Status: DC | PRN
  Administered 2011-09-22: 1 via TOPICAL

## 2011-09-22 MED ORDER — LACTATED RINGERS IV SOLN
INTRAVENOUS | Status: DC
  Administered 2011-09-22: 1000 mL via INTRAVENOUS

## 2011-09-22 MED ORDER — PROPOFOL 10 MG/ML IV EMUL
INTRAVENOUS | Status: DC | PRN
  Administered 2011-09-22: 20 mg via INTRAVENOUS
  Administered 2011-09-22: 150 mg via INTRAVENOUS

## 2011-09-22 MED ORDER — CEFAZOLIN SODIUM-DEXTROSE 2-3 GM-% IV SOLR
INTRAVENOUS | Status: AC
  Filled 2011-09-22: qty 50

## 2011-09-22 MED ORDER — ENOXAPARIN SODIUM 40 MG/0.4ML ~~LOC~~ SOLN
40.0000 mg | Freq: Once | SUBCUTANEOUS | Status: AC
  Administered 2011-09-22: 40 mg via SUBCUTANEOUS

## 2011-09-22 MED ORDER — MIDAZOLAM HCL 2 MG/2ML IJ SOLN
INTRAMUSCULAR | Status: AC
  Filled 2011-09-22: qty 2

## 2011-09-22 MED ORDER — ONDANSETRON HCL 4 MG/2ML IJ SOLN
INTRAMUSCULAR | Status: AC
  Filled 2011-09-22: qty 2

## 2011-09-22 MED ORDER — FENTANYL CITRATE 0.05 MG/ML IJ SOLN
INTRAMUSCULAR | Status: DC | PRN
  Administered 2011-09-22 (×4): 25 ug via INTRAVENOUS

## 2011-09-22 MED ORDER — GLYCOPYRROLATE 0.2 MG/ML IJ SOLN
INTRAMUSCULAR | Status: AC
  Filled 2011-09-22: qty 1

## 2011-09-22 MED ORDER — BUPIVACAINE HCL (PF) 0.5 % IJ SOLN
INTRAMUSCULAR | Status: AC
  Filled 2011-09-22: qty 30

## 2011-09-22 MED ORDER — PROPOFOL 10 MG/ML IV EMUL
INTRAVENOUS | Status: AC
  Filled 2011-09-22: qty 20

## 2011-09-22 MED ORDER — BUPIVACAINE HCL (PF) 0.5 % IJ SOLN
INTRAMUSCULAR | Status: DC | PRN
  Administered 2011-09-22: 9 mL

## 2011-09-22 MED ORDER — ONDANSETRON HCL 4 MG/2ML IJ SOLN: 4.0000 mg | Freq: Once | INTRAMUSCULAR | Status: DC | PRN

## 2011-09-22 MED ORDER — LIDOCAINE HCL (PF) 1 % IJ SOLN
INTRAMUSCULAR | Status: AC
  Filled 2011-09-22: qty 5

## 2011-09-22 MED ORDER — LIDOCAINE HCL (CARDIAC) 10 MG/ML IV SOLN
INTRAVENOUS | Status: DC | PRN
  Administered 2011-09-22: 10 mg via INTRAVENOUS

## 2011-09-22 MED ORDER — GLYCOPYRROLATE 0.2 MG/ML IJ SOLN
0.2000 mg | Freq: Once | INTRAMUSCULAR | Status: AC
  Administered 2011-09-22: 0.2 mg via INTRAVENOUS

## 2011-09-22 SURGICAL SUPPLY — 41 items
BAG HAMPER (MISCELLANEOUS) ×2 IMPLANT
CLOTH BEACON ORANGE TIMEOUT ST (SAFETY) ×2 IMPLANT
COVER LIGHT HANDLE STERIS (MISCELLANEOUS) ×4 IMPLANT
DECANTER SPIKE VIAL GLASS SM (MISCELLANEOUS) IMPLANT
DURAPREP 26ML APPLICATOR (WOUND CARE) ×2 IMPLANT
ELECT REM PT RETURN 9FT ADLT (ELECTROSURGICAL) ×2
ELECTRODE REM PT RTRN 9FT ADLT (ELECTROSURGICAL) ×1 IMPLANT
FORMALIN 10 PREFIL 480ML (MISCELLANEOUS) ×2 IMPLANT
GLOVE BIO SURGEON STRL SZ7.5 (GLOVE) ×2 IMPLANT
GLOVE BIOGEL PI IND STRL 7.0 (GLOVE) IMPLANT
GLOVE BIOGEL PI INDICATOR 7.0 (GLOVE) ×1
GLOVE ECLIPSE 6.5 STRL STRAW (GLOVE) ×1 IMPLANT
GOWN STRL REIN XL XLG (GOWN DISPOSABLE) ×5 IMPLANT
INST SET MAJOR GENERAL (KITS) ×2 IMPLANT
KIT ROOM TURNOVER APOR (KITS) ×2 IMPLANT
MANIFOLD NEPTUNE II (INSTRUMENTS) ×2 IMPLANT
NDL HYPO 25X1 1.5 SAFETY (NEEDLE) ×1 IMPLANT
NEEDLE HYPO 25X1 1.5 SAFETY (NEEDLE) ×2 IMPLANT
NS IRRIG 1000ML POUR BTL (IV SOLUTION) ×2 IMPLANT
PACK ABDOMINAL MAJOR (CUSTOM PROCEDURE TRAY) ×2 IMPLANT
PAD ARMBOARD 7.5X6 YLW CONV (MISCELLANEOUS) ×2 IMPLANT
PATCH VENTRAL SMALL 4.3 (Mesh Specialty) ×1 IMPLANT
SET BASIN LINEN APH (SET/KITS/TRAYS/PACK) ×2 IMPLANT
SPONGE GAUZE 2X2 8PLY STRL LF (GAUZE/BANDAGES/DRESSINGS) ×3 IMPLANT
SPONGE GAUZE 4X4 12PLY (GAUZE/BANDAGES/DRESSINGS) ×2 IMPLANT
STAPLER VISISTAT (STAPLE) ×1 IMPLANT
SUT CHROMIC 2 0 SH (SUTURE) ×2 IMPLANT
SUT ETHIBOND NAB MO 7 #0 18IN (SUTURE) ×1 IMPLANT
SUT NOVA NAB GS-21 1 T12 (SUTURE) IMPLANT
SUT NOVA NAB GS-22 2 2-0 T-19 (SUTURE) IMPLANT
SUT NOVA NAB GS-26 0 60 (SUTURE) IMPLANT
SUT PROLENE 0 CT 1 CR/8 (SUTURE) IMPLANT
SUT SILK 2 0 (SUTURE)
SUT SILK 2-0 18XBRD TIE 12 (SUTURE) IMPLANT
SUT VIC AB 2-0 CT1 27 (SUTURE) ×2
SUT VIC AB 2-0 CT1 TAPERPNT 27 (SUTURE) ×1 IMPLANT
SUT VIC AB 3-0 SH 27 (SUTURE) ×2
SUT VIC AB 3-0 SH 27X BRD (SUTURE) ×1 IMPLANT
SUT VIC AB 4-0 PS2 27 (SUTURE) IMPLANT
SYR CONTROL 10ML LL (SYRINGE) ×2 IMPLANT
TAPE CLOTH SURG 4X10 WHT LF (GAUZE/BANDAGES/DRESSINGS) ×1 IMPLANT

## 2011-09-22 NOTE — Interval H&P Note (Signed)
History and Physical Interval Note:  09/22/2011 9:33 AM  Sabrina Thompson  has presented today for surgery, with the diagnosis of Incisional Hernia  The various methods of treatment have been discussed with the patient and family. After consideration of risks, benefits and other options for treatment, the patient has consented to  Procedure(s) (LRB): HERNIA REPAIR INCISIONAL (N/A) as a surgical intervention .  The patient's history has been reviewed, patient examined, no change in status, stable for surgery.  I have reviewed the patient's chart and labs.  Questions were answered to the patient's satisfaction.     Franky Macho A

## 2011-09-22 NOTE — Transfer of Care (Signed)
Immediate Anesthesia Transfer of Care Note  Patient: Sabrina Thompson  Procedure(s) Performed: Procedure(s) (LRB): HERNIA REPAIR INCISIONAL (N/A) INSERTION OF MESH (N/A)  Patient Location: PACU  Anesthesia Type: General  Level of Consciousness: awake  Airway & Oxygen Therapy: Patient Spontanous Breathing and non-rebreather face mask  Post-op Assessment: Report given to PACU RN, Post -op Vital signs reviewed and stable and Patient moving all extremities  Post vital signs: Reviewed and stable  Complications: No apparent anesthesia complications

## 2011-09-22 NOTE — Anesthesia Postprocedure Evaluation (Signed)
Anesthesia Post Note  Patient: Sabrina Thompson  Procedure(s) Performed: Procedure(s) (LRB): HERNIA REPAIR INCISIONAL (N/A) INSERTION OF MESH (N/A)  Anesthesia type: General  Patient location: PACU  Post pain: Pain level controlled  Post assessment: Post-op Vital signs reviewed, Patient's Cardiovascular Status Stable, Respiratory Function Stable, Patent Airway, No signs of Nausea or vomiting and Pain level controlled  Last Vitals:  Filed Vitals:   09/22/11 1109  BP: 124/64  Pulse: 79  Temp: 36.4 C  Resp: 14    Post vital signs: Reviewed and stable  Level of consciousness: awake and alert   Complications: No apparent anesthesia complications

## 2011-09-22 NOTE — Op Note (Signed)
Patient:  Sabrina Thompson  DOB:  10-Jun-1955  MRN:  [id number]   Preop Diagnosis:  Incisional hernia  Postop Diagnosis:  Same  Procedure:  Incisional herniorrhaphy with mesh  Surgeon:  Franky Macho, M.D.  Anes:  General endotracheal  Indications:  Patient is a 56 year old white female presented to the emergency room earlier this week with an incarcerated incisional hernia just above her umbilicus. This is probably secondary to a laparoscopic cholecystectomy done in the remote past. He was reduced in the emergency room. She now comes the operating room for incisional herniorrhaphy with mesh. The risks and benefits of the procedure the bleeding, infection, and recurrence of the hernia were fully explained to the patient, gave informed consent.  Procedure note:  Patient is placed the supine position. After induction of general endotracheal anesthesia, the abdomen was prepped and draped using usual sterile technique with DuraPrep. Surgical site confirmation was performed.  A supraumbilical incision was made down to the fascia. The umbilicus was freed away from the underlying hernia as it was in direct continuity with the hernia sac. The hernia sac was excised down to the fascia. A was disposed of. A 4.3 cm proceed ventral patch was then inserted into the abdominal cavity and secured against the abdominal wall using 0 Ethibond sutures to the tabs. The overlying fascia was then reapproximated transversely using 0 Ethibond interrupted sutures. The base the umbilicus was closed using a 2-0 chromic gut running suture. The base the umbilicus was secured back to the fascia using a 2-0 Vicryl interrupted suture. The subcutaneous layer was reapproximated using 3-0 Vicryl interrupted sutures. The skin was closed using staples. 0.5% Sensorcaine was instilled the surrounding wound. Betadine ointment dry sterile dressings were applied.  All tape and needle counts were correct at the end of the procedure. Patient  was extubated in the operating room and went back to recovery room awake in stable condition.  Complications:  None  EBL:  Minimal  Specimen:  None

## 2011-09-22 NOTE — Anesthesia Preprocedure Evaluation (Addendum)
Anesthesia Evaluation  Patient identified by MRN, date of birth, ID band Patient awake    Reviewed: Allergy & Precautions, H&P , NPO status , Patient's Chart, lab work & pertinent test results  Airway Mallampati: I TM Distance: >3 FB Neck ROM: Full    Dental No notable dental hx.    Pulmonary    Pulmonary exam normal       Cardiovascular hypertension, Pt. on medications Rhythm:Regular Rate:Normal     Neuro/Psych Seizures -,  PSYCHIATRIC DISORDERS Depression    GI/Hepatic negative GI ROS, Neg liver ROS,   Endo/Other  Type 2, Oral Hypoglycemic Agents  Renal/GU negative Renal ROS     Musculoskeletal negative musculoskeletal ROS (+)   Abdominal Normal abdominal exam  (+)   Peds  Hematology negative hematology ROS (+)   Anesthesia Other Findings   Reproductive/Obstetrics                          Anesthesia Physical Anesthesia Plan  ASA: II  Anesthesia Plan: General   Post-op Pain Management:    Induction: Intravenous  Airway Management Planned: LMA  Additional Equipment:   Intra-op Plan:   Post-operative Plan: Extubation in OR  Informed Consent: I have reviewed the patients History and Physical, chart, labs and discussed the procedure including the risks, benefits and alternatives for the proposed anesthesia with the patient or authorized representative who has indicated his/her understanding and acceptance.     Plan Discussed with: CRNA  Anesthesia Plan Comments:         Anesthesia Quick Evaluation

## 2011-09-22 NOTE — Anesthesia Procedure Notes (Signed)
Procedure Name: LMA Insertion Date/Time: 09/22/2011 10:14 AM Performed by: Franco Nones Pre-anesthesia Checklist: Patient identified, Patient being monitored, Emergency Drugs available, Timeout performed and Suction available Patient Re-evaluated:Patient Re-evaluated prior to inductionOxygen Delivery Method: Circle System Utilized Preoxygenation: Pre-oxygenation with 100% oxygen Intubation Type: IV induction Ventilation: Mask ventilation without difficulty LMA: LMA inserted LMA Size: 4.0 Number of attempts: 1 Placement Confirmation: positive ETCO2 and breath sounds checked- equal and bilateral

## 2011-09-25 ENCOUNTER — Encounter (HOSPITAL_COMMUNITY): Payer: Self-pay | Admitting: General Surgery

## 2012-03-06 ENCOUNTER — Telehealth: Payer: Self-pay

## 2012-03-06 NOTE — Telephone Encounter (Signed)
Tried to call pt at [phone] and recording said the number has been changed or disconnected. First letter was mailed to pt on 02/16/2012 to [address], [place]. 2nd letter was mailed on 02/29/2012 to [address], [place].

## 2012-03-25 ENCOUNTER — Telehealth: Payer: Self-pay | Admitting: *Deleted

## 2012-03-25 NOTE — Telephone Encounter (Signed)
Called, LMOM to call.

## 2012-03-25 NOTE — Telephone Encounter (Signed)
OV scheduled with Lorenza Burton, NP on 04/16/2012 at 11:00 Am for constipation prior to colonoscopy.

## 2012-03-25 NOTE — Telephone Encounter (Signed)
Sabrina Thompson called back today to be triaged. She will be available the rest of today thru Thursday. Please call her back. Thank you.

## 2012-03-26 NOTE — Telephone Encounter (Signed)
See phone note of 03/25/2012.

## 2012-04-16 ENCOUNTER — Ambulatory Visit: Payer: PRIVATE HEALTH INSURANCE | Admitting: Urgent Care

## 2012-05-09 ENCOUNTER — Ambulatory Visit: Payer: PRIVATE HEALTH INSURANCE | Admitting: Gastroenterology

## 2012-10-03 ENCOUNTER — Other Ambulatory Visit (HOSPITAL_COMMUNITY): Payer: Self-pay | Admitting: Nurse Practitioner

## 2012-10-03 DIAGNOSIS — Z139 Encounter for screening, unspecified: Secondary | ICD-10-CM

## 2012-10-21 ENCOUNTER — Ambulatory Visit (HOSPITAL_COMMUNITY): Payer: PRIVATE HEALTH INSURANCE

## 2012-12-10 ENCOUNTER — Ambulatory Visit (HOSPITAL_COMMUNITY)
Admission: RE | Admit: 2012-12-10 | Discharge: 2012-12-10 | Disposition: A | Discharge status: Home / Self Care | Payer: PRIVATE HEALTH INSURANCE | Source: Ambulatory Visit | Attending: Family Medicine | Admitting: Family Medicine

## 2012-12-10 DIAGNOSIS — Z1231 Encounter for screening mammogram for malignant neoplasm of breast: Secondary | ICD-10-CM | POA: Insufficient documentation

## 2012-12-10 DIAGNOSIS — Z139 Encounter for screening, unspecified: Secondary | ICD-10-CM

## 2012-12-12 ENCOUNTER — Other Ambulatory Visit: Payer: Self-pay | Admitting: Family Medicine

## 2012-12-12 DIAGNOSIS — R928 Other abnormal and inconclusive findings on diagnostic imaging of breast: Secondary | ICD-10-CM

## 2013-01-01 ENCOUNTER — Encounter: Payer: Self-pay | Admitting: General Practice

## 2013-01-01 ENCOUNTER — Other Ambulatory Visit (HOSPITAL_COMMUNITY): Payer: Self-pay | Admitting: Nurse Practitioner

## 2013-01-01 ENCOUNTER — Ambulatory Visit (HOSPITAL_COMMUNITY)
Admission: RE | Admit: 2013-01-01 | Discharge: 2013-01-01 | Disposition: A | Discharge status: Home / Self Care | Payer: PRIVATE HEALTH INSURANCE | Source: Ambulatory Visit | Attending: Family Medicine | Admitting: Family Medicine

## 2013-01-01 ENCOUNTER — Ambulatory Visit (HOSPITAL_COMMUNITY)
Admission: RE | Admit: 2013-01-01 | Discharge: 2013-01-01 | Disposition: A | Discharge status: Home / Self Care | Payer: PRIVATE HEALTH INSURANCE | Source: Ambulatory Visit | Attending: Nurse Practitioner | Admitting: Nurse Practitioner

## 2013-01-01 DIAGNOSIS — R928 Other abnormal and inconclusive findings on diagnostic imaging of breast: Secondary | ICD-10-CM | POA: Insufficient documentation

## 2013-01-01 DIAGNOSIS — N63 Unspecified lump in unspecified breast: Secondary | ICD-10-CM

## 2013-01-02 ENCOUNTER — Ambulatory Visit: Payer: PRIVATE HEALTH INSURANCE | Admitting: Gastroenterology

## 2013-01-03 ENCOUNTER — Ambulatory Visit: Payer: PRIVATE HEALTH INSURANCE | Admitting: Gastroenterology

## 2013-01-29 ENCOUNTER — Ambulatory Visit: Payer: PRIVATE HEALTH INSURANCE | Admitting: Gastroenterology

## 2013-06-12 ENCOUNTER — Other Ambulatory Visit (HOSPITAL_COMMUNITY): Payer: Self-pay | Admitting: Nurse Practitioner

## 2013-06-12 DIAGNOSIS — R928 Other abnormal and inconclusive findings on diagnostic imaging of breast: Secondary | ICD-10-CM

## 2013-06-12 DIAGNOSIS — Z09 Encounter for follow-up examination after completed treatment for conditions other than malignant neoplasm: Secondary | ICD-10-CM

## 2013-06-12 DIAGNOSIS — N63 Unspecified lump in unspecified breast: Secondary | ICD-10-CM

## 2013-06-24 ENCOUNTER — Encounter (HOSPITAL_COMMUNITY): Payer: PRIVATE HEALTH INSURANCE

## 2013-07-02 ENCOUNTER — Encounter (HOSPITAL_COMMUNITY): Payer: PRIVATE HEALTH INSURANCE

## 2013-07-23 ENCOUNTER — Encounter (HOSPITAL_COMMUNITY): Payer: PRIVATE HEALTH INSURANCE

## 2013-07-31 ENCOUNTER — Emergency Department (HOSPITAL_COMMUNITY): Payer: PRIVATE HEALTH INSURANCE

## 2013-07-31 ENCOUNTER — Emergency Department (HOSPITAL_COMMUNITY)
Admission: EM | Admit: 2013-07-31 | Discharge: 2013-07-31 | Disposition: A | Discharge status: Home / Self Care | Payer: PRIVATE HEALTH INSURANCE | Attending: Emergency Medicine | Admitting: Emergency Medicine

## 2013-07-31 ENCOUNTER — Encounter (HOSPITAL_COMMUNITY): Payer: Self-pay | Admitting: Emergency Medicine

## 2013-07-31 DIAGNOSIS — E119 Type 2 diabetes mellitus without complications: Secondary | ICD-10-CM | POA: Insufficient documentation

## 2013-07-31 DIAGNOSIS — M79601 Pain in right arm: Secondary | ICD-10-CM

## 2013-07-31 DIAGNOSIS — M79609 Pain in unspecified limb: Secondary | ICD-10-CM | POA: Insufficient documentation

## 2013-07-31 DIAGNOSIS — Z79899 Other long term (current) drug therapy: Secondary | ICD-10-CM | POA: Insufficient documentation

## 2013-07-31 DIAGNOSIS — F3289 Other specified depressive episodes: Secondary | ICD-10-CM | POA: Insufficient documentation

## 2013-07-31 DIAGNOSIS — F329 Major depressive disorder, single episode, unspecified: Secondary | ICD-10-CM | POA: Insufficient documentation

## 2013-07-31 DIAGNOSIS — Z87891 Personal history of nicotine dependence: Secondary | ICD-10-CM | POA: Insufficient documentation

## 2013-07-31 DIAGNOSIS — I10 Essential (primary) hypertension: Secondary | ICD-10-CM | POA: Insufficient documentation

## 2013-07-31 MED ORDER — OXYCODONE-ACETAMINOPHEN 5-325 MG PO TABS
1.0000 | ORAL_TABLET | Freq: Once | ORAL | Status: AC
  Administered 2013-07-31: 1 via ORAL
  Filled 2013-07-31: qty 1

## 2013-07-31 MED ORDER — IBUPROFEN 800 MG PO TABS: 800.0000 mg | ORAL_TABLET | Freq: Three times a day (TID) | ORAL | Status: DC

## 2013-07-31 MED ORDER — HYDROCODONE-ACETAMINOPHEN 5-325 MG PO TABS: 1.0000 | ORAL_TABLET | Freq: Four times a day (QID) | ORAL | Status: DC | PRN

## 2013-07-31 MED ORDER — KETOROLAC TROMETHAMINE 30 MG/ML IJ SOLN
30.0000 mg | Freq: Once | INTRAMUSCULAR | Status: AC
  Administered 2013-07-31: 30 mg via INTRAMUSCULAR
  Filled 2013-07-31: qty 1

## 2013-07-31 NOTE — ED Notes (Signed)
Patient c/o right arm pain. Per patient has "burning and swelling in arm starting 2 days ago. Patient reports felling a "pop" in it this morning and swelling getting worse. Patient states "I just doesn't feel right. Denies any numbness or tingling.

## 2013-07-31 NOTE — ED Provider Notes (Signed)
CSN: [id number]     Arrival date & time 07/31/13  1023 History  This chart was scribed for Sabrina Munchobert Lockwood, MD by Leone PayorSonum Patel, ED Scribe. This patient was seen in room [facility]19[facility] and the patient's care was started 10:58 AM.    Chief Complaint  Patient presents with  . Arm Pain     The history is provided by the patient. No language interpreter was used.    HPI Comments: Sabrina Thompson is a 58 y.o. female who presents to the Emergency Department complaining of 2 days of right upper arm pain that worsened this morning. Patient states she was brushing her hair when she felt a popping sensation to the right upper arm. She states the pain lasted 10-15 minutes and describes it as burning pain. She states the burning pain has subsided but the initial arm pain remains. She reports having prior pain to the same area after an injury 6 months ago. She states this pain resolved spontaneously without needing medical attention. She denies elbow pain, numbness, weakness.   Past Medical History  Diagnosis Date  . Diabetes mellitus   . Hypertension   . Depression   . Seizures     30 yrs ago on meds-unknown etiology and no seizures or meds since then   Past Surgical History  Procedure Laterality Date  . Back surgery      lumbar surgery  . Cholecystectomy  2005    [facility]_Dr Lovell SheehanJenkins  . Dilation and curettage of uterus  1981  . Incisional hernia repair  09/22/2011    Procedure: HERNIA REPAIR INCISIONAL;  Surgeon: Dalia HeadingMark A Jenkins, MD;  Location: [facility];  Service: General;  Laterality: N/A;   Family History  Problem Relation Age of Onset  . Cancer Other   . Diabetes Other    History  Substance Use Topics  . Smoking status: Former Smoker -- 3.00 packs/day for 12 years    Types: Cigarettes    Quit date: 09/19/2008  . Smokeless tobacco: Never Used  . Alcohol Use: No   OB History   Grav Para Term Preterm Abortions TAB SAB Ect Mult Living   2 1  1 1  1   1      Review of Systems  Constitutional:        Per HPI, otherwise negative  HENT:       Per HPI, otherwise negative  Respiratory:       Per HPI, otherwise negative  Cardiovascular:       Per HPI, otherwise negative  Gastrointestinal: Negative for vomiting.  Endocrine:       Negative aside from HPI  Genitourinary:       Neg aside from HPI   Musculoskeletal:       Per HPI, otherwise negative  Skin: Negative.   Neurological: Negative for syncope.      Allergies  Prednisone  Home Medications   Prior to Admission medications   Medication Sig Start Date End Date Taking? Authorizing Provider  fish oil-omega-3 fatty acids 1000 MG capsule Take 1 g by mouth 2 (two) times daily.   Yes Historical Provider, MD  FLUoxetine (PROZAC) 10 MG capsule  07/22/13  Yes Historical Provider, MD  glipiZIDE (GLUCOTROL) 5 MG tablet Take 5 mg by mouth 2 (two) times daily.   Yes Historical Provider, MD  ibuprofen (ADVIL,MOTRIN) 200 MG tablet Take 800 mg by mouth every 6 (six) hours as needed. Pain   Yes Historical Provider, MD  lidocaine (LMX) 4 % cream  Apply 1 application topically as needed (pain).   Yes Historical Provider, MD  lisinopril-hydrochlorothiazide (PRINZIDE,ZESTORETIC) 20-25 MG per tablet Take 1 tablet by mouth daily.   Yes Historical Provider, MD  Menthol-Methyl Salicylate (MUSCLE RUB) 10-15 % CREA Apply 1 application topically as needed for muscle pain.   Yes Historical Provider, MD  metFORMIN (GLUCOPHAGE) 1000 MG tablet Take 1,000 mg by mouth 2 (two) times daily with a meal.   Yes Historical Provider, MD   BP 152/75  Pulse 67  Temp(Src) 98.2 F (36.8 C) (Oral)  Resp 18  Ht 5\' 5"  (1.651 m)  Wt 203 lb (92.08 kg)  BMI 33.78 kg/m2  SpO2 100% Physical Exam  Nursing note and vitals reviewed. Constitutional: She is oriented to person, place, and time. She appears well-developed and well-nourished. No distress.  HENT:  Head: Normocephalic and atraumatic.  Eyes: Conjunctivae and EOM are normal.  Cardiovascular: Normal rate and  regular rhythm.   Pulmonary/Chest: Effort normal and breath sounds normal. No stridor. No respiratory distress.  Abdominal: She exhibits no distension.  Musculoskeletal: She exhibits tenderness. She exhibits no edema.  Inflammation to the dorsum of right wrist.   RUE: tenderness to distal medial bicep with extension.    Neurological: She is alert and oriented to person, place, and time. No cranial nerve deficit.  NVI intact distally.   Skin: Skin is warm and dry.  Psychiatric: She has a normal mood and affect.    ED Course  Procedures (including critical care time)  DIAGNOSTIC STUDIES: Oxygen Saturation is 100% on RA, normal by my interpretation.    COORDINATION OF CARE: 11:04 AM Discussed treatment plan with pt at bedside and pt agreed to plan.  Imaging Review [place] Venous Img Upper Uni Right  07/31/2013   CLINICAL DATA:  Right upper extremity pain and swelling, history of diabetes and hypertension, former smoker, evaluate for DVT  EXAM: RIGHT UPPER EXTREMITY VENOUS DOPPLER ULTRASOUND  TECHNIQUE: Gray-scale sonography with graded compression, as well as color Doppler and duplex ultrasound were performed to evaluate the upper extremity deep venous system from the level of the subclavian vein and including the jugular, axillary, basilic, radial, ulnar and upper cephalic vein. Spectral Doppler was utilized to evaluate flow at rest and with distal augmentation maneuvers.  COMPARISON:  None.  FINDINGS: Internal Jugular Vein: No evidence of thrombus. Normal compressibility, respiratory phasicity and response to augmentation.  Subclavian Vein: No evidence of thrombus. Normal compressibility, respiratory phasicity and response to augmentation.  Axillary Vein: No evidence of thrombus. Normal compressibility, respiratory phasicity and response to augmentation.  Cephalic Vein: No evidence of thrombus. Normal compressibility, respiratory phasicity and response to augmentation.  Basilic Vein: No evidence of  thrombus. Normal compressibility, respiratory phasicity and response to augmentation.  Brachial Veins: No evidence of thrombus. Normal compressibility, respiratory phasicity and response to augmentation.  Radial Veins: No evidence of thrombus. Normal compressibility, respiratory phasicity and response to augmentation.  Ulnar Veins: No evidence of thrombus. Normal compressibility, respiratory phasicity and response to augmentation.  Venous Reflux:  None visualized.  Other Findings:  None visualized.  IMPRESSION: No evidence of DVT within the right upper extremity.   Electronically Signed   By: Simonne ComeJohn  Watts M.D.   On: 07/31/2013 13:27     1:52 PM On repeat exam the patient is smiling.  She, her colleague and I had a conversation about all results, the need for orthopedics followup. MDM   Final diagnoses:  Pain of right upper extremity  I personally performed the services described in this documentation, which was scribed in my presence. The recorded information has been reviewed and is accurate.   Patient presents with a painful, swollen of the right upper extremity. And on exam she is awake alert, or logically intact, but with notable pain on palpation, as well as the size discrepancy between the arms.  On ultrasound was reassuring.  No trauma, suggesting need for x-ray. This was discussed with the patient.  The patient will try a medication for one week, followup with orthopedics for further evaluation and management.  Sabrina Munch, MD 07/31/13 [phone]

## 2013-07-31 NOTE — Discharge Instructions (Signed)
As discussed, your evaluation today has been largely reassuring.  But, it is important that you monitor your condition carefully, and do not hesitate to return to the ED if you develop new, or concerning changes in your condition. ? ?Otherwise, please follow-up with your physician for appropriate ongoing care. ? ?

## 2013-08-18 ENCOUNTER — Ambulatory Visit: Payer: PRIVATE HEALTH INSURANCE | Admitting: Orthopedic Surgery

## 2013-09-01 ENCOUNTER — Ambulatory Visit: Payer: PRIVATE HEALTH INSURANCE | Admitting: Orthopedic Surgery

## 2013-09-16 ENCOUNTER — Ambulatory Visit (HOSPITAL_COMMUNITY)
Admission: RE | Admit: 2013-09-16 | Discharge: 2013-09-16 | Disposition: A | Discharge status: Home / Self Care | Payer: PRIVATE HEALTH INSURANCE | Source: Ambulatory Visit | Attending: Nurse Practitioner | Admitting: Nurse Practitioner

## 2013-09-16 DIAGNOSIS — R928 Other abnormal and inconclusive findings on diagnostic imaging of breast: Secondary | ICD-10-CM

## 2013-09-16 DIAGNOSIS — Z09 Encounter for follow-up examination after completed treatment for conditions other than malignant neoplasm: Secondary | ICD-10-CM

## 2013-09-16 DIAGNOSIS — N63 Unspecified lump in unspecified breast: Secondary | ICD-10-CM

## 2013-12-15 ENCOUNTER — Encounter (HOSPITAL_COMMUNITY): Payer: Self-pay | Admitting: Emergency Medicine

## 2014-04-03 ENCOUNTER — Other Ambulatory Visit (HOSPITAL_COMMUNITY): Payer: Self-pay | Admitting: Nurse Practitioner

## 2014-04-03 DIAGNOSIS — Z1231 Encounter for screening mammogram for malignant neoplasm of breast: Secondary | ICD-10-CM

## 2014-04-06 ENCOUNTER — Ambulatory Visit (HOSPITAL_COMMUNITY)
Admission: RE | Admit: 2014-04-06 | Discharge: 2014-04-06 | Disposition: A | Discharge status: Home / Self Care | Payer: PRIVATE HEALTH INSURANCE | Source: Ambulatory Visit | Attending: Nurse Practitioner | Admitting: Nurse Practitioner

## 2014-04-06 DIAGNOSIS — Z1231 Encounter for screening mammogram for malignant neoplasm of breast: Secondary | ICD-10-CM | POA: Insufficient documentation

## 2014-07-09 ENCOUNTER — Emergency Department (HOSPITAL_COMMUNITY)
Admission: EM | Admit: 2014-07-09 | Discharge: 2014-07-09 | Disposition: A | Discharge status: Home / Self Care | Payer: PRIVATE HEALTH INSURANCE | Attending: Emergency Medicine | Admitting: Emergency Medicine

## 2014-07-09 ENCOUNTER — Encounter (HOSPITAL_COMMUNITY): Payer: Self-pay

## 2014-07-09 DIAGNOSIS — F329 Major depressive disorder, single episode, unspecified: Secondary | ICD-10-CM | POA: Insufficient documentation

## 2014-07-09 DIAGNOSIS — Z79899 Other long term (current) drug therapy: Secondary | ICD-10-CM | POA: Insufficient documentation

## 2014-07-09 DIAGNOSIS — I10 Essential (primary) hypertension: Secondary | ICD-10-CM | POA: Insufficient documentation

## 2014-07-09 DIAGNOSIS — E1165 Type 2 diabetes mellitus with hyperglycemia: Secondary | ICD-10-CM | POA: Insufficient documentation

## 2014-07-09 DIAGNOSIS — R739 Hyperglycemia, unspecified: Secondary | ICD-10-CM

## 2014-07-09 DIAGNOSIS — Z791 Long term (current) use of non-steroidal anti-inflammatories (NSAID): Secondary | ICD-10-CM | POA: Insufficient documentation

## 2014-07-09 DIAGNOSIS — E860 Dehydration: Secondary | ICD-10-CM | POA: Insufficient documentation

## 2014-07-09 DIAGNOSIS — R1110 Vomiting, unspecified: Secondary | ICD-10-CM

## 2014-07-09 DIAGNOSIS — R197 Diarrhea, unspecified: Secondary | ICD-10-CM

## 2014-07-09 DIAGNOSIS — Z87891 Personal history of nicotine dependence: Secondary | ICD-10-CM | POA: Insufficient documentation

## 2014-07-09 LAB — URINALYSIS, ROUTINE W REFLEX MICROSCOPIC
BILIRUBIN URINE: NEGATIVE
Glucose, UA: NEGATIVE mg/dL
HGB URINE DIPSTICK: NEGATIVE
KETONES UR: NEGATIVE mg/dL
LEUKOCYTES UA: NEGATIVE
Nitrite: NEGATIVE
PROTEIN: NEGATIVE mg/dL
Specific Gravity, Urine: 1.025 (ref 1.005–1.030)
Urobilinogen, UA: 0.2 mg/dL (ref 0.0–1.0)
pH: 5.5 (ref 5.0–8.0)

## 2014-07-09 LAB — CBC WITH DIFFERENTIAL/PLATELET
BASOS ABS: 0.1 10*3/uL (ref 0.0–0.1)
BASOS PCT: 1 % (ref 0–1)
Eosinophils Absolute: 0.1 10*3/uL (ref 0.0–0.7)
Eosinophils Relative: 2 % (ref 0–5)
HEMATOCRIT: 34.5 % — AB (ref 36.0–46.0)
HEMOGLOBIN: 11.5 g/dL — AB (ref 12.0–15.0)
LYMPHS PCT: 28 % (ref 12–46)
Lymphs Abs: 1.9 10*3/uL (ref 0.7–4.0)
MCH: 29.1 pg (ref 26.0–34.0)
MCHC: 33.3 g/dL (ref 30.0–36.0)
MCV: 87.3 fL (ref 78.0–100.0)
MONO ABS: 0.5 10*3/uL (ref 0.1–1.0)
Monocytes Relative: 7 % (ref 3–12)
NEUTROS ABS: 4.2 10*3/uL (ref 1.7–7.7)
NEUTROS PCT: 62 % (ref 43–77)
Platelets: 317 10*3/uL (ref 150–400)
RBC: 3.95 MIL/uL (ref 3.87–5.11)
RDW: 13 % (ref 11.5–15.5)
WBC: 6.7 10*3/uL (ref 4.0–10.5)

## 2014-07-09 LAB — COMPREHENSIVE METABOLIC PANEL
ALBUMIN: 4 g/dL (ref 3.5–5.0)
ALK PHOS: 60 U/L (ref 38–126)
ALT: 31 U/L (ref 14–54)
ANION GAP: 10 (ref 5–15)
AST: 26 U/L (ref 15–41)
BUN: 14 mg/dL (ref 6–20)
CHLORIDE: 94 mmol/L — AB (ref 101–111)
CO2: 27 mmol/L (ref 22–32)
CREATININE: 0.68 mg/dL (ref 0.44–1.00)
Calcium: 9.2 mg/dL (ref 8.9–10.3)
GFR calc Af Amer: >60 mL/min (ref >60)
Glucose, Bld: 212 mg/dL — ABNORMAL HIGH (ref 65–99)
Potassium: 3.7 mmol/L (ref 3.5–5.1)
SODIUM: 131 mmol/L — AB (ref 135–145)
Total Bilirubin: 0.6 mg/dL (ref 0.3–1.2)
Total Protein: 6.9 g/dL (ref 6.5–8.1)

## 2014-07-09 LAB — CBG MONITORING, ED: Glucose-Capillary: 197 mg/dL — ABNORMAL HIGH (ref 65–99)

## 2014-07-09 MED ORDER — ONDANSETRON HCL 8 MG PO TABS: 8.0000 mg | ORAL_TABLET | Freq: Three times a day (TID) | ORAL | Status: DC | PRN

## 2014-07-09 NOTE — ED Notes (Signed)
Pt c/o n/v/d since 1am this morning.  Denies pain.

## 2014-07-09 NOTE — ED Provider Notes (Signed)
CSN: [id number]     Arrival date & time 07/09/14  1441 History   First MD Initiated Contact with Patient 07/09/14 1612     Chief Complaint  Patient presents with  . Emesis  . Diarrhea    Patient is a 59 y.o. female presenting with vomiting and diarrhea. The history is provided by the patient.  Emesis Severity:  Moderate Timing:  Intermittent Progression:  Improving Chronicity:  New Relieved by: soup. Worsened by:  Nothing tried Associated symptoms: diarrhea   Associated symptoms: no abdominal pain, no cough and no fever   Risk factors: diabetes   Risk factors: no sick contacts and no travel to endemic areas   Diarrhea Associated symptoms: vomiting   Associated symptoms: no abdominal pain, no recent cough and no fever   pt reports she has multiple episodes of vomiting/diarrhea for past day No blood in vomit/stool No cp/sob No abd pain No fever No hospitalizations No recent antibiotics   Past Medical History  Diagnosis Date  . Diabetes mellitus   . Hypertension   . Depression   . Seizures     30 yrs ago on meds-unknown etiology and no seizures or meds since then   Past Surgical History  Procedure Laterality Date  . Back surgery      lumbar surgery  . Cholecystectomy  2005    [facility]_Dr Lovell Sheehan  . Dilation and curettage of uterus  1981  . Incisional hernia repair  09/22/2011    Procedure: HERNIA REPAIR INCISIONAL;  Surgeon: Dalia Heading, MD;  Location: [facility];  Service: General;  Laterality: N/A;   Family History  Problem Relation Age of Onset  . Cancer Other   . Diabetes Other    History  Substance Use Topics  . Smoking status: Former Smoker -- 3.00 packs/day for 12 years    Types: Cigarettes    Quit date: 09/19/2008  . Smokeless tobacco: Never Used  . Alcohol Use: No   OB History    Gravida Para Term Preterm AB TAB SAB Ectopic Multiple Living   Review of Systems  Constitutional: Negative for fever.  Gastrointestinal: Positive for  vomiting and diarrhea. Negative for abdominal pain.  All other systems reviewed and are negative.     Allergies  Prednisone  Home Medications   Prior to Admission medications   Medication Sig Start Date End Date Taking? Authorizing Provider  fish oil-omega-3 fatty acids 1000 MG capsule Take 1 g by mouth 2 (two) times daily.    Historical Provider, MD  FLUoxetine (PROZAC) 10 MG capsule  07/22/13   Historical Provider, MD  glipiZIDE (GLUCOTROL) 5 MG tablet Take 5 mg by mouth 2 (two) times daily.    Historical Provider, MD  HYDROcodone-acetaminophen (NORCO/VICODIN) 5-325 MG per tablet Take 1 tablet by mouth every 6 (six) hours as needed for moderate pain or severe pain. 07/31/13   Gerhard Munch, MD  ibuprofen (ADVIL,MOTRIN) 200 MG tablet Take 800 mg by mouth every 6 (six) hours as needed. Pain    Historical Provider, MD  ibuprofen (ADVIL,MOTRIN) 800 MG tablet Take 1 tablet (800 mg total) by mouth 3 (three) times daily. 07/31/13   Gerhard Munch, MD  lidocaine (LMX) 4 % cream Apply 1 application topically as needed (pain).    Historical Provider, MD  lisinopril-hydrochlorothiazide (PRINZIDE,ZESTORETIC) 20-25 MG per tablet Take 1 tablet by mouth daily.    Historical Provider, MD  Menthol-Methyl Salicylate (MUSCLE  RUB) 10-15 % CREA Apply 1 application topically as needed for muscle pain.    Historical Provider, MD  metFORMIN (GLUCOPHAGE) 1000 MG tablet Take 1,000 mg by mouth 2 (two) times daily with a meal.    Historical Provider, MD  ondansetron (ZOFRAN) 8 MG tablet Take 1 tablet (8 mg total) by mouth every 8 (eight) hours as needed. 07/09/14   Zadie Rhineonald Satoria Dunlop, MD   BP 131/70 mmHg  Pulse 78  Temp(Src) 98.7 F (37.1 C) (Oral)  Resp 18  Ht 5\' 6"  (1.676 m)  Wt 200 lb (90.719 kg)  BMI 32.30 kg/m2  SpO2 100% Physical Exam CONSTITUTIONAL: Well developed/well nourished HEAD: Normocephalic/atraumatic EYES: EOMI/PERRL, no icterus ENMT: Mucous membranes dry NECK: supple no meningeal  signs SPINE/BACK:entire spine nontender CV: S1/S2 noted, no murmurs/rubs/gallops noted LUNGS: Lungs are clear to auscultation bilaterally, no apparent distress ABDOMEN: soft, nontender, no rebound or guarding, bowel sounds noted throughout abdomen GU:no cva tenderness NEURO: Pt is awake/alert/appropriate, moves all extremitiesx4.  No facial droop.   EXTREMITIES: pulses normal/equal, full ROM SKIN: warm, color normal PSYCH: no abnormalities of mood noted, alert and oriented to situation  ED Course  Procedures  Labs Review Labs Reviewed  CBC WITH DIFFERENTIAL/PLATELET - Abnormal; Notable for the following:    Hemoglobin 11.5 (*)    HCT 34.5 (*)    All other components within normal limits  COMPREHENSIVE METABOLIC PANEL - Abnormal; Notable for the following:    Sodium 131 (*)    Chloride 94 (*)    Glucose, Bld 212 (*)    All other components within normal limits  CBG MONITORING, ED - Abnormal; Notable for the following:    Glucose-Capillary 197 (*)    All other components within normal limits  URINALYSIS, ROUTINE W REFLEX MICROSCOPIC (NOT [facility])   Pt already feeling improved She would like to be discharged and does not want any meds here Suspect viral illness She has no abd tenderness Advised to monitor glucose and hold glipizide until symptoms resolved  MDM   Final diagnoses:  Dehydration  Vomiting and diarrhea  Hyperglycemia    Nursing notes including past medical history and social history reviewed and considered in documentation Labs/vital reviewed myself and considered during evaluation     Zadie Rhineonald Arlenne Kimbley, MD 07/09/14 1713

## 2016-02-11 ENCOUNTER — Ambulatory Visit: Payer: PRIVATE HEALTH INSURANCE | Admitting: Family Medicine

## 2016-02-16 DIAGNOSIS — E119 Type 2 diabetes mellitus without complications: Secondary | ICD-10-CM | POA: Insufficient documentation

## 2016-02-16 DIAGNOSIS — I10 Essential (primary) hypertension: Secondary | ICD-10-CM | POA: Insufficient documentation

## 2016-02-16 DIAGNOSIS — F32A Depression, unspecified: Secondary | ICD-10-CM | POA: Insufficient documentation

## 2016-02-16 DIAGNOSIS — F329 Major depressive disorder, single episode, unspecified: Secondary | ICD-10-CM | POA: Insufficient documentation

## 2016-02-16 DIAGNOSIS — E785 Hyperlipidemia, unspecified: Secondary | ICD-10-CM | POA: Insufficient documentation

## 2016-02-17 ENCOUNTER — Ambulatory Visit: Payer: PRIVATE HEALTH INSURANCE | Admitting: Family Medicine

## 2016-02-25 IMAGING — MG MM DIGITAL DIAGNOSTIC UNILAT R
2 series · 2 of 2 positions shown · non-contrast
Comparison: Priors

CLINICAL DATA: Followup right breast mass

EXAM:
DIGITAL DIAGNOSTIC  right MAMMOGRAM WITH CAD

[R CC]
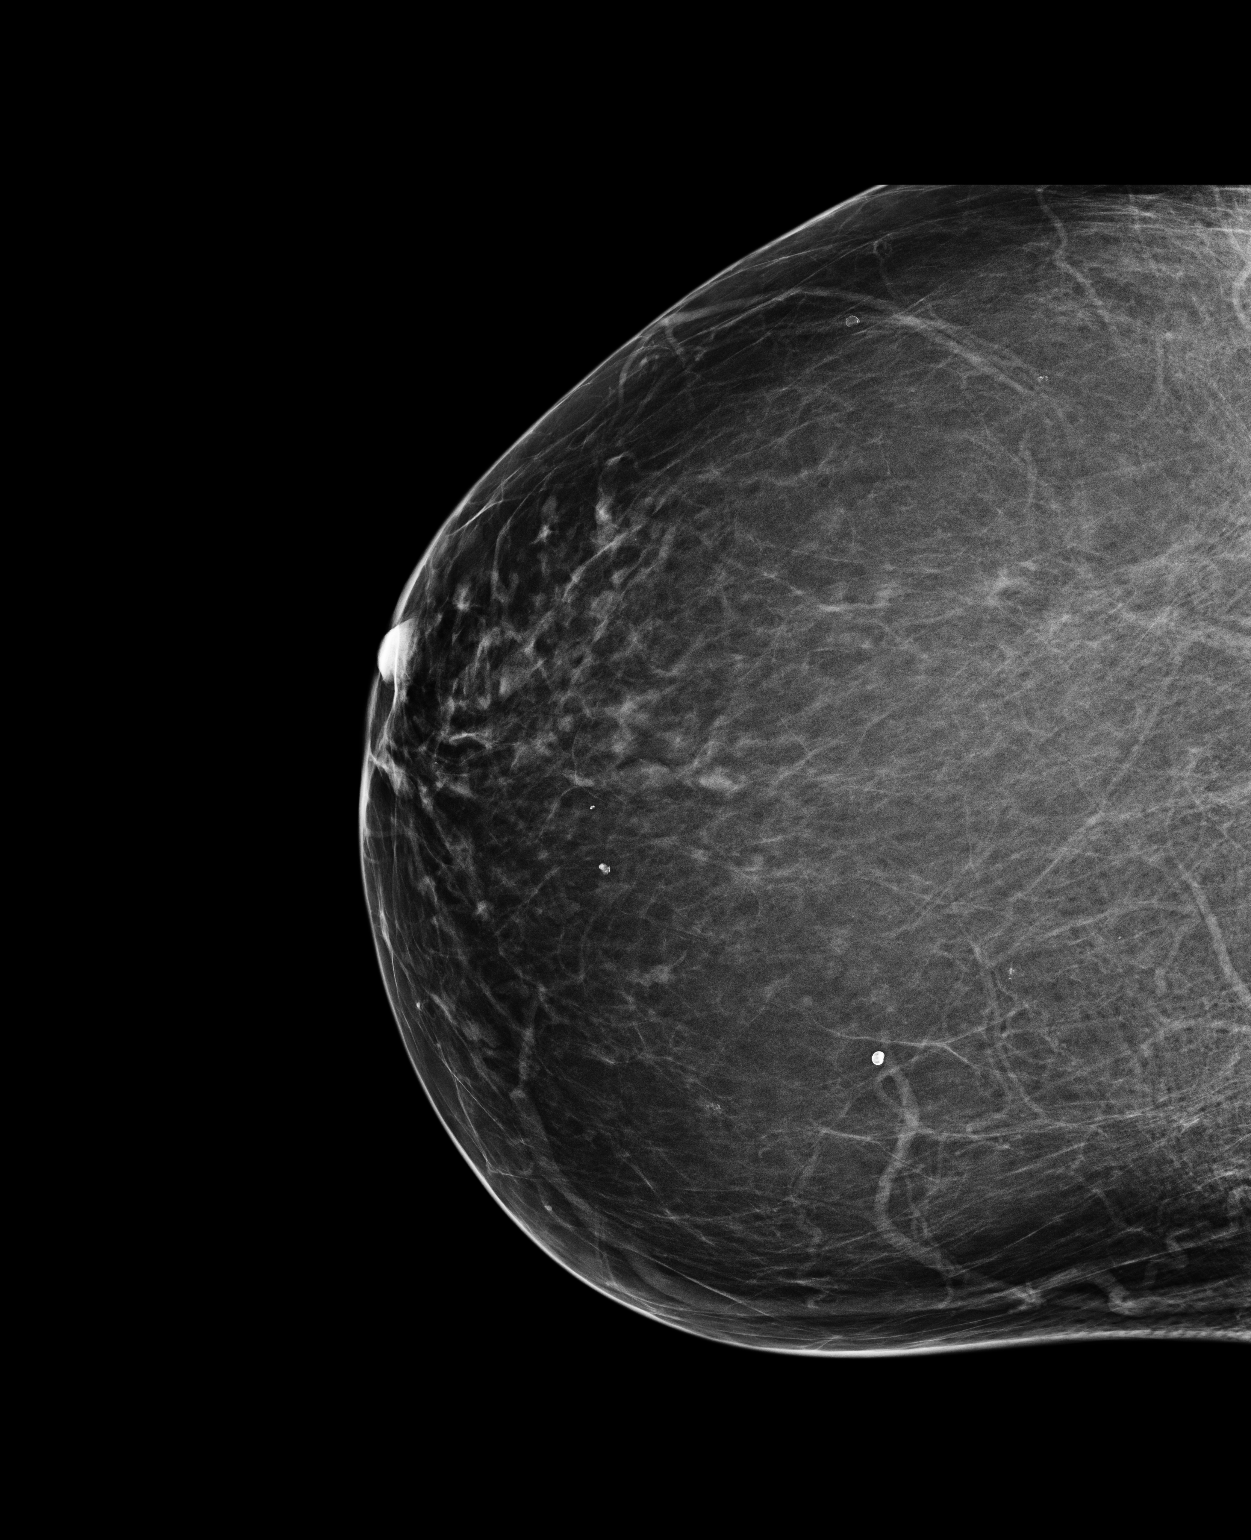

[R MLO]
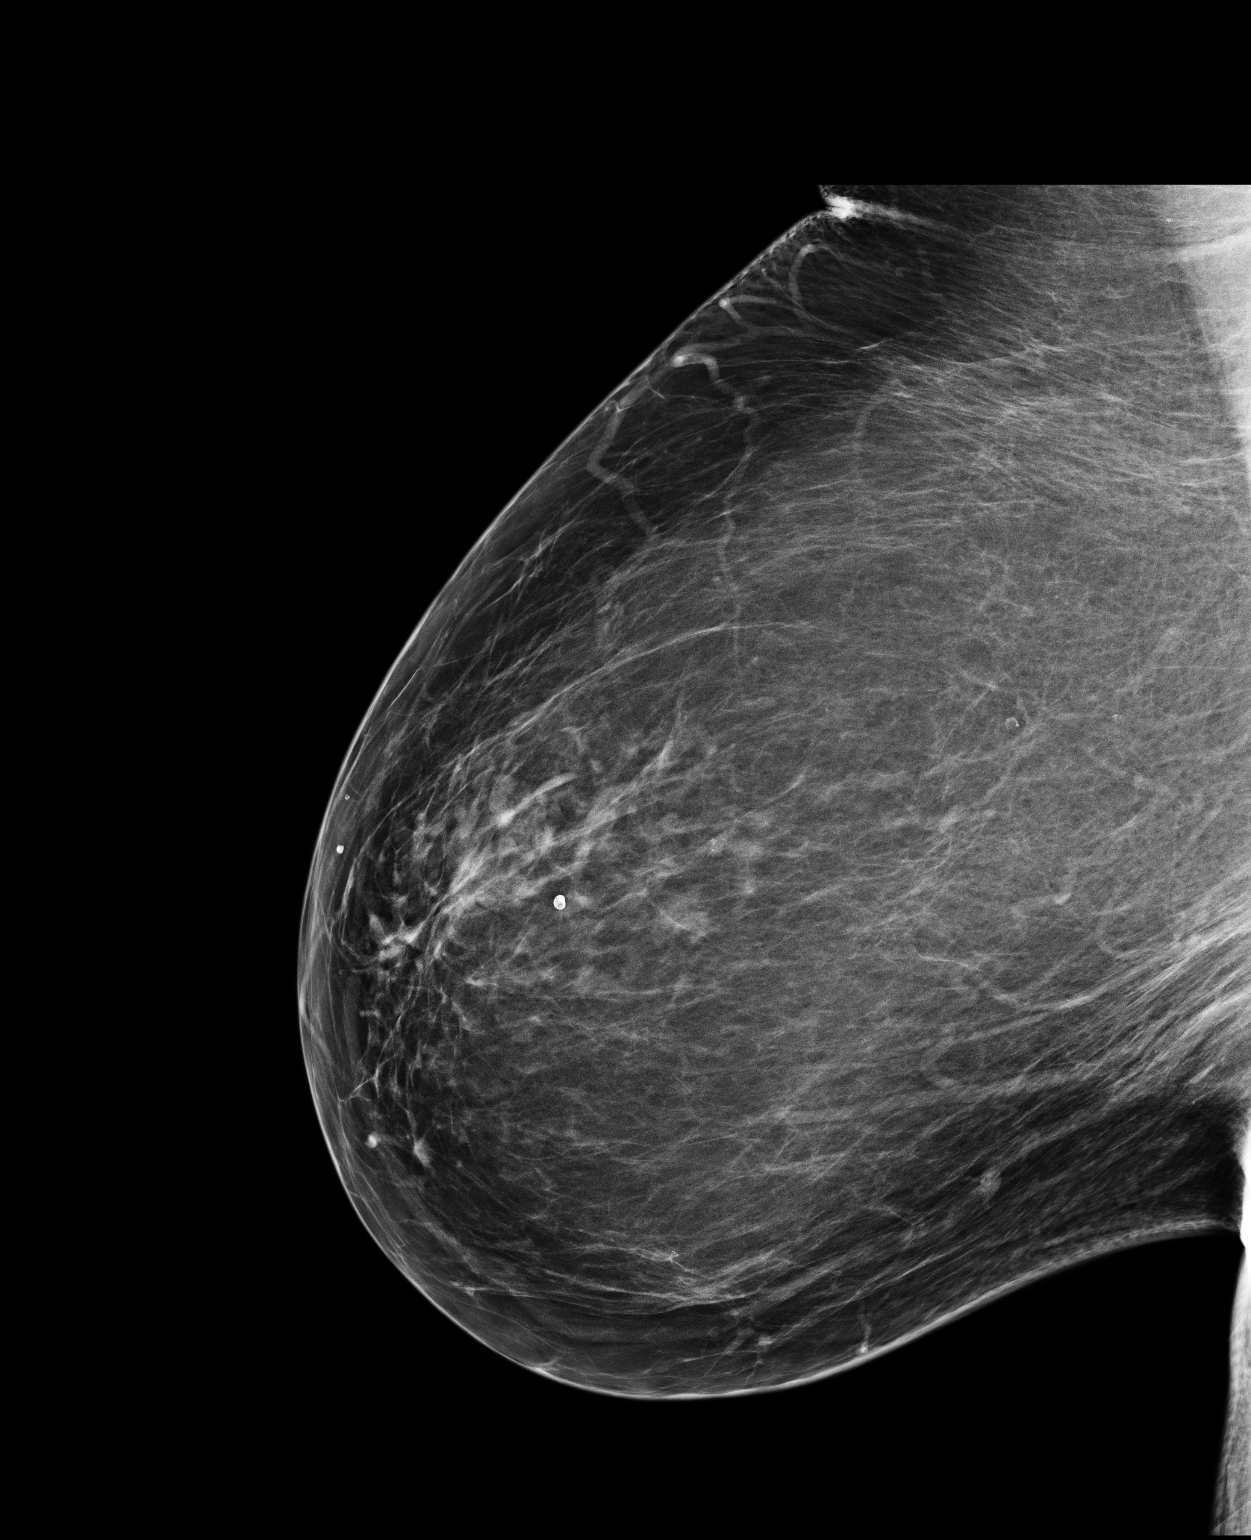

[2 of 2 positions shown; findings below may reference images not displayed]

ACR Breast Density Category b: There are scattered areas of
fibroglandular density.
FINDINGS: The previously seen right lower inner quadrant mass is no longer
visualized and may have represented a benign appearing intramammary
lymph node or other benign finding. No suspicious finding is
identified in the right breast.

Mammographic images were processed with CAD.
IMPRESSION: No evidence for malignancy in the right breast.

RECOMMENDATION:
Bilateral screening mammography November 2013

I have discussed the findings and recommendations with the patient.
Results were also provided in writing at the conclusion of the
visit. If applicable, a reminder letter will be sent to the patient
regarding the next appointment.

BI-RADS CATEGORY  1: Negative.

## 2016-03-28 ENCOUNTER — Ambulatory Visit: Payer: PRIVATE HEALTH INSURANCE | Admitting: Family Medicine

## 2016-03-31 ENCOUNTER — Ambulatory Visit (INDEPENDENT_AMBULATORY_CARE_PROVIDER_SITE_OTHER): Payer: BLUE CROSS/BLUE SHIELD | Admitting: Family Medicine

## 2016-03-31 ENCOUNTER — Encounter: Payer: Self-pay | Admitting: Family Medicine

## 2016-03-31 VITALS — BP 136/88 | HR 86 | Temp 97.2°F | Resp 16 | Ht 66.0 in | Wt 195.1 lb

## 2016-03-31 DIAGNOSIS — N393 Stress incontinence (female) (male): Secondary | ICD-10-CM | POA: Diagnosis not present

## 2016-03-31 DIAGNOSIS — M47816 Spondylosis without myelopathy or radiculopathy, lumbar region: Secondary | ICD-10-CM

## 2016-03-31 DIAGNOSIS — M79674 Pain in right toe(s): Secondary | ICD-10-CM | POA: Insufficient documentation

## 2016-03-31 DIAGNOSIS — E119 Type 2 diabetes mellitus without complications: Secondary | ICD-10-CM | POA: Diagnosis not present

## 2016-03-31 DIAGNOSIS — I10 Essential (primary) hypertension: Secondary | ICD-10-CM | POA: Diagnosis not present

## 2016-03-31 DIAGNOSIS — B351 Tinea unguium: Secondary | ICD-10-CM | POA: Insufficient documentation

## 2016-03-31 DIAGNOSIS — Z7689 Persons encountering health services in other specified circumstances: Secondary | ICD-10-CM

## 2016-03-31 HISTORY — DX: Spondylosis without myelopathy or radiculopathy, lumbar region: M47.816

## 2016-03-31 NOTE — Progress Notes (Signed)
Chief Complaint  Patient presents with  . Establish Care   New to establish Prior care health dept, just got insurance Not up to date with prevention - needs PAP, mammogram, colo, and shot update  Well controlled diabetes with an A1c in the 6-7 range per patient Well controlled blood pressure NOT on a statin for unclear reasons.  Used to take one but stopped when cholesterol improved. Is on fluoxetine for depression with prn use of trazodone to sleep, states is stable but scores high on the PHQ-9.  Refuses counseling. Takes detrol for SUI  Had back surgery  Many years ago for a disc injury, and was left with a limp and weakness in the left leg.  States this is why she does not exercise.  Cannot afford the Y or pool therapy.  No known diabetes complications - kidney, eye or nerve.  Has not had an eye exam in years Does NOT go to a dentist  Divorced X 3, lives with a friend and  Her 2 min-pin dogs    Patient Active Problem List   Diagnosis Date Noted  . 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 SUI (stress urinary incontinence, female) 03/31/2016  . Type 2 diabetes mellitus (HCC) 02/16/2016  . Benign essential HTN 02/16/2016  . HLD (hyperlipidemia) 02/16/2016  . Depression (emotion) 02/16/2016    Outpatient Encounter Prescriptions as of 03/31/2016  Medication Sig  . FLUoxetine (PROZAC) 20 MG capsule Take 20 mg by mouth daily.  Marland Kitchen glipiZIDE (GLUCOTROL) 5 MG tablet Take 5 mg by mouth 2 (two) times daily.  Marland Kitchen ibuprofen (ADVIL,MOTRIN) 200 MG tablet Take 800 mg by mouth every 6 (six) hours as needed. Pain  . lisinopril-hydrochlorothiazide (PRINZIDE,ZESTORETIC) 20-25 MG per tablet Take 1 tablet by mouth daily.  . Menthol-Methyl Salicylate (MUSCLE RUB) 10-15 % CREA Apply 1 application topically as needed for muscle pain.  . metFORMIN (GLUCOPHAGE) 1000 MG tablet Take 1,000 mg by mouth 2 (two) times daily with a meal.  . tolterodine (DETROL) 2 MG tablet Take 2 mg  by mouth 2 (two) times daily.  . traZODone (DESYREL) 100 MG tablet Take 100 mg by mouth at bedtime as needed for sleep.   No facility-administered encounter medications on file as of 03/31/2016.     Past Medical History:  Diagnosis Date  . Allergy   . Anemia   . Anxiety   . 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 Depression   . Diabetes mellitus   . Hyperlipidemia   . Hypertension   . 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    Past Surgical History:  Procedure Laterality Date  . BACK SURGERY     lumbar surgery  . CHOLECYSTECTOMY  2005   [facility]_Dr Lovell Sheehan  . DILATION AND CURETTAGE OF UTERUS  1981  . HERNIA REPAIR     umbilical  . INCISIONAL HERNIA REPAIR  09/22/2011   Procedure: HERNIA REPAIR INCISIONAL;  Surgeon: Dalia Heading, MD;  Location: [facility];  Service: General;  Laterality: N/A;  . SPINE SURGERY  1998   disc injury    Social History   Social History  . Marital status: Divorced    Spouse name: N/A  . Number of children: 1  . Years of education: 73   Occupational History  . Archivist   Social History Main Topics  . Smoking status: Former Smoker    Packs/day: 3.00  Years: 12.00    Types: Cigarettes    Quit date: 09/19/2008  . Smokeless tobacco: Never Used  . Alcohol use No  . Drug use: No  . Sexual activity: Yes    Birth control/ protection: Post-menopausal   Other Topics Concern  . Not on file   Social History Narrative   Lives alone, rents a room from a friend       Family History  Problem Relation Age of Onset  . Cancer Other   . Diabetes Other   . Arthritis Mother   . Depression Mother   . Diabetes Mother   . Heart disease Mother   . Miscarriages / [place] Mother   . Cancer Father     prostate  . Alcohol abuse Father   . Arthritis Father   . Diabetes Father   . Cancer Brother     lung  . Drug abuse Daughter     asddicted to pain pills    Review of Systems    Constitutional: Negative for chills, fever and weight loss.  HENT: Negative for congestion and hearing loss.        Dental prob  Eyes: Positive for blurred vision. Negative for pain.       Needs to see eye doctor  Respiratory: Negative for cough and shortness of breath.   Cardiovascular: Negative for chest pain and leg swelling.  Gastrointestinal: Negative for abdominal pain, constipation, diarrhea and heartburn.  Genitourinary: Negative for dysuria and frequency.  Musculoskeletal: Positive for back pain. Negative for falls, joint pain and myalgias.       Gait disorder  Neurological: Negative for dizziness, seizures and headaches.  Psychiatric/Behavioral: Negative for depression. The patient is not nervous/anxious and does not have insomnia.        Depression stable    BP 136/88   Pulse 86   Temp 97.2 F (36.2 C) (Temporal)   Resp 16   Ht 5\' 6"  (1.676 m)   Wt 195 lb 1 oz (88.5 kg)   LMP 02/13/2005 (Approximate)   SpO2 93%   BMI 31.48 kg/m   Physical Exam  Constitutional: She is oriented to person, place, and time. She appears well-developed and well-nourished.  Antalgic gait  HENT:  Head: Normocephalic and atraumatic.  Right Ear: External ear normal.  Left Ear: External ear normal.  Mouth/Throat: Oropharynx is clear and moist.  Poor dentition, periodontal disease and fractures  Eyes: Conjunctivae are normal. Pupils are equal, round, and reactive to light.  Neck: Normal range of motion. Neck supple. No thyromegaly present.  Cardiovascular: Normal rate, regular rhythm and normal heart sounds.   Pulmonary/Chest: Effort normal and breath sounds normal. No respiratory distress.  Abdominal: Soft. Bowel sounds are normal.  Musculoskeletal: Normal range of motion. She exhibits no edema.  Lymphadenopathy:    She has no cervical adenopathy.  Neurological: She is alert and oriented to person, place, and time.  Gait normal  Skin: Skin is warm and dry.  Psychiatric: She has a  normal mood and affect. Her behavior is normal. Thought content normal.  Nursing note and vitals reviewed.  ASSESSMENT/PLAN:  1. Spondylosis of lumbar region without myelopathy or radiculopathy   2. SUI (stress urinary incontinence, female)   3. Type 2 diabetes mellitus without complication, without long-term current use of insulin (HCC)   4. Benign essential HTN   5. Encounter to establish care with new doctor    Patient Instructions  Need old records Due for an eye exam Due for  mammogram Due for colon cancer screening Due for immunizations We will get these caught up  Continue current medications Stay as active as you can manage Watch the diabetic diet and portions  Need a physical exam     Eustace MooreYvonne Sue Necia Kamm, MD

## 2016-03-31 NOTE — Patient Instructions (Addendum)
Need old records Due for an eye exam Due for mammogram Due for colon cancer screening Due for immunizations We will get these caught up  Continue current medications Stay as active as you can manage Watch the diabetic diet and portions  Need a physical exam

## 2016-06-30 ENCOUNTER — Ambulatory Visit (INDEPENDENT_AMBULATORY_CARE_PROVIDER_SITE_OTHER): Payer: BLUE CROSS/BLUE SHIELD | Admitting: Family Medicine

## 2016-06-30 ENCOUNTER — Encounter: Payer: Self-pay | Admitting: Family Medicine

## 2016-06-30 VITALS — BP 136/78 | HR 88 | Temp 97.2°F | Resp 16 | Ht 66.0 in | Wt 195.1 lb

## 2016-06-30 DIAGNOSIS — Z1211 Encounter for screening for malignant neoplasm of colon: Secondary | ICD-10-CM | POA: Diagnosis not present

## 2016-06-30 DIAGNOSIS — R87619 Unspecified abnormal cytological findings in specimens from cervix uteri: Secondary | ICD-10-CM | POA: Diagnosis not present

## 2016-06-30 DIAGNOSIS — E119 Type 2 diabetes mellitus without complications: Secondary | ICD-10-CM

## 2016-06-30 DIAGNOSIS — Z23 Encounter for immunization: Secondary | ICD-10-CM | POA: Diagnosis not present

## 2016-06-30 DIAGNOSIS — E785 Hyperlipidemia, unspecified: Secondary | ICD-10-CM

## 2016-06-30 DIAGNOSIS — Z780 Asymptomatic menopausal state: Secondary | ICD-10-CM

## 2016-06-30 DIAGNOSIS — Z1231 Encounter for screening mammogram for malignant neoplasm of breast: Secondary | ICD-10-CM

## 2016-06-30 DIAGNOSIS — Z1239 Encounter for other screening for malignant neoplasm of breast: Secondary | ICD-10-CM

## 2016-06-30 DIAGNOSIS — I10 Essential (primary) hypertension: Secondary | ICD-10-CM | POA: Diagnosis not present

## 2016-06-30 LAB — LIPID PANEL
CHOL/HDL RATIO: 5.1 ratio — AB (ref <5.0)
CHOLESTEROL: 195 mg/dL (ref <200)
HDL: 38 mg/dL — AB (ref >50)
LDL Cholesterol: 121 mg/dL — ABNORMAL HIGH (ref <100)
TRIGLYCERIDES: 182 mg/dL — AB (ref <150)
VLDL: 36 mg/dL — ABNORMAL HIGH (ref <30)

## 2016-06-30 LAB — COMPLETE METABOLIC PANEL WITH GFR
ALT: 30 U/L — AB (ref 6–29)
AST: 20 U/L (ref 10–35)
Albumin: 4.4 g/dL (ref 3.6–5.1)
Alkaline Phosphatase: 59 U/L (ref 33–130)
BUN: 10 mg/dL (ref 7–25)
CHLORIDE: 95 mmol/L — AB (ref 98–110)
CO2: 30 mmol/L (ref 20–31)
CREATININE: 0.63 mg/dL (ref 0.50–0.99)
Calcium: 9.4 mg/dL (ref 8.6–10.4)
GFR, Est African American: >89 mL/min (ref >=60)
GFR, Est Non African American: >89 mL/min (ref >=60)
Glucose, Bld: 185 mg/dL — ABNORMAL HIGH (ref 65–99)
POTASSIUM: 4 mmol/L (ref 3.5–5.3)
Sodium: 134 mmol/L — ABNORMAL LOW (ref 135–146)
Total Bilirubin: 0.3 mg/dL (ref 0.2–1.2)
Total Protein: 6.6 g/dL (ref 6.1–8.1)

## 2016-06-30 LAB — URINALYSIS, ROUTINE W REFLEX MICROSCOPIC
Bilirubin Urine: NEGATIVE
Hgb urine dipstick: NEGATIVE
Ketones, ur: NEGATIVE
NITRITE: NEGATIVE
Protein, ur: NEGATIVE
Specific Gravity, Urine: 1.013 (ref 1.001–1.035)
pH: 6.5 (ref 5.0–8.0)

## 2016-06-30 LAB — URINALYSIS, MICROSCOPIC ONLY
BACTERIA UA: NONE SEEN [HPF]
CASTS: NONE SEEN [LPF]
Crystals: NONE SEEN [HPF]
RBC / HPF: NONE SEEN RBC/HPF (ref <=2)
Squamous Epithelial / LPF: NONE SEEN [HPF] (ref <=5)
WBC UA: NONE SEEN WBC/HPF (ref <=5)
YEAST: NONE SEEN [HPF]

## 2016-06-30 LAB — CBC
HEMATOCRIT: 36.1 % (ref 35.0–45.0)
HEMOGLOBIN: 11.9 g/dL (ref 11.7–15.5)
MCH: 29 pg (ref 27.0–33.0)
MCHC: 33 g/dL (ref 32.0–36.0)
MCV: 88 fL (ref 80.0–100.0)
MPV: 9.4 fL (ref 7.5–12.5)
Platelets: 319 10*3/uL (ref 140–400)
RBC: 4.1 MIL/uL (ref 3.80–5.10)
RDW: 13.7 % (ref 11.0–15.0)
WBC: 6.8 10*3/uL (ref 3.8–10.8)

## 2016-06-30 MED ORDER — METFORMIN HCL 1000 MG PO TABS: 1000.0000 mg | ORAL_TABLET | Freq: Two times a day (BID) | ORAL | 3 refills | Status: DC

## 2016-06-30 MED ORDER — ATORVASTATIN CALCIUM 20 MG PO TABS: 20.0000 mg | ORAL_TABLET | Freq: Every day | ORAL | 3 refills | Status: DC

## 2016-06-30 MED ORDER — GLIPIZIDE 5 MG PO TABS: 5.0000 mg | ORAL_TABLET | Freq: Two times a day (BID) | ORAL | 3 refills | Status: AC

## 2016-06-30 MED ORDER — OXYBUTYNIN CHLORIDE 5 MG PO TABS: 5.0000 mg | ORAL_TABLET | Freq: Two times a day (BID) | ORAL | 11 refills | Status: DC

## 2016-06-30 MED ORDER — LISINOPRIL-HYDROCHLOROTHIAZIDE 20-25 MG PO TABS: 1.0000 | ORAL_TABLET | Freq: Every day | ORAL | 3 refills | Status: DC

## 2016-06-30 NOTE — Progress Notes (Addendum)
Chief Complaint  Patient presents with  . Follow-up  Patient is here for follow-up visit. She is a well-controlled diabetic. Her last hemoglobin A1c was in October, and was 6.7. She is due for repeat laboratory tests today. A log of her sugars is reviewed. They're consistently under 200. I explained to her that she doesn't need to check her blood sugar daily with well-controlled non-insulin-dependent diabetes. I recommend that she check it periodically, for symptoms only. Her blood pressure is well controlled. She is not on a statin. I'm going to order lipids today and start her on Lipitor 20 mg daily. I explained that this is for reduction of risk of cardiovascular disease, heart attack, and stroke. We discussed health maintenance. She is due for colon cancer screening. She is due for breast cancer screening. She is due for a Pap smear. She's never had a mammogram. The Pap smear is scheduled, the rest are ordered. She tells me her last Pap was abnormal. It was years ago. She is not worried because she states it is "only HPV". I explained her that HPV increases her risk of cervical cancer and that follow-up Pap smears are especially important for her. She is unable to afford Detrol. I'm changing her to generic ditropan 5 mg twice a day. This is less expensive. She'll let me know if it is effective.    Patient Active Problem List   Diagnosis Date Noted  . Abnormal Pap smear of cervix 06/30/2016  . 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 SUI (stress urinary incontinence, female) 03/31/2016  . Onychomycosis of toenail 03/31/2016  . Type 2 diabetes mellitus (HCC) 02/16/2016  . Benign essential HTN 02/16/2016  . HLD (hyperlipidemia) 02/16/2016  . Depression (emotion) 02/16/2016    Outpatient Encounter Prescriptions as of 06/30/2016  Medication Sig  . FLUoxetine (PROZAC) 20 MG capsule Take 20 mg by mouth daily.  Marland Kitchen. glipiZIDE (GLUCOTROL) 5 MG tablet Take 1 tablet (5 mg  total) by mouth 2 (two) times daily.  Marland Kitchen. ibuprofen (ADVIL,MOTRIN) 200 MG tablet Take 800 mg by mouth every 6 (six) hours as needed. Pain  . lisinopril-hydrochlorothiazide (PRINZIDE,ZESTORETIC) 20-25 MG tablet Take 1 tablet by mouth daily.  . Menthol-Methyl Salicylate (MUSCLE RUB) 10-15 % CREA Apply 1 application topically as needed for muscle pain.  . metFORMIN (GLUCOPHAGE) 1000 MG tablet Take 1 tablet (1,000 mg total) by mouth 2 (two) times daily with a meal.  . traZODone (DESYREL) 100 MG tablet Take 100 mg by mouth at bedtime as needed for sleep.  . [DISCONTINUED] glipiZIDE (GLUCOTROL) 5 MG tablet Take 5 mg by mouth 2 (two) times daily.  . [DISCONTINUED] lisinopril-hydrochlorothiazide (PRINZIDE,ZESTORETIC) 20-25 MG per tablet Take 1 tablet by mouth daily.  . [DISCONTINUED] metFORMIN (GLUCOPHAGE) 1000 MG tablet Take 1,000 mg by mouth 2 (two) times daily with a meal.  . [DISCONTINUED] tolterodine (DETROL) 2 MG tablet Take 2 mg by mouth 2 (two) times daily.  Marland Kitchen. atorvastatin (LIPITOR) 20 MG tablet Take 1 tablet (20 mg total) by mouth daily.  Marland Kitchen. doxepin (SINEQUAN) 10 MG capsule   . oxybutynin (DITROPAN) 5 MG tablet Take 1 tablet (5 mg total) by mouth 2 (two) times daily.   No facility-administered encounter medications on file as of 06/30/2016.     Allergies  Allergen Reactions  . Prednisone Hives    Also causes confusion    Review of Systems  Constitutional: Negative for activity change, appetite change and unexpected weight change.  HENT: Positive for  dental problem. Negative for congestion, postnasal drip and rhinorrhea.   Eyes: Negative for redness and visual disturbance.  Respiratory: Negative for cough and shortness of breath.   Cardiovascular: Negative for chest pain, palpitations and leg swelling.  Gastrointestinal: Negative for abdominal pain, constipation and diarrhea.  Genitourinary: Negative for difficulty urinating, frequency and vaginal bleeding.  Musculoskeletal: Negative for  arthralgias and back pain.  Neurological: Negative for dizziness and headaches.  Psychiatric/Behavioral: Negative for dysphoric mood and sleep disturbance. The patient is not nervous/anxious.     BP 136/78 (BP Location: Right Arm, Patient Position: Sitting, Cuff Size: Normal)   Pulse 88   Temp 97.2 F (36.2 C) (Temporal)   Resp 16   Ht 5\' 6"  (1.676 m)   Wt 195 lb 1.3 oz (88.5 kg)   LMP 02/13/2005 (Approximate)   SpO2 97%   BMI 31.49 kg/m   Physical Exam  Constitutional: She is oriented to person, place, and time. She appears well-developed and well-nourished.  HENT:  Head: Normocephalic and atraumatic.  Right Ear: External ear normal.  Left Ear: External ear normal.  Mouth/Throat: Oropharynx is clear and moist.  Poor dentition  Eyes: Conjunctivae are normal. Pupils are equal, round, and reactive to light.  Neck: Normal range of motion. Neck supple. No thyromegaly present.  Cardiovascular: Normal rate, regular rhythm and normal heart sounds.   Pulmonary/Chest: Effort normal and breath sounds normal. No respiratory distress.  Abdominal: Soft. Bowel sounds are normal.  Musculoskeletal: Normal range of motion. She exhibits no edema.  Lymphadenopathy:    She has no cervical adenopathy.  Neurological: She is alert and oriented to person, place, and time. She displays normal reflexes.  Gait normal  Skin: Skin is warm and dry.  Psychiatric: She has a normal mood and affect. Her behavior is normal. Thought content normal.  Nursing note and vitals reviewed. 60 minutes was spent with this patient, more than 50% in counseling. We discussed diabetes management diet and exercise. We discussed home testing. We discussed the importance of diet and exercise. Weight loss is recommended. We discussed her health maintenance deficiencies and had a correct them.  ASSESSMENT/PLAN:  1. Benign essential HTN Well-controlled  2. Type 2 diabetes mellitus without complication, without long-term  current use of insulin (HCC) Well-controlled, due for labs - COMPLETE METABOLIC PANEL WITH GFR - CBC - Hemoglobin A1c - TSH - Urinalysis, Routine w reflex microscopic - Lipid panel  3. Hyperlipidemia, unspecified hyperlipidemia type Not on statin. Started on Lipitor. Expectations, side effects are discussed  4. Abnormal cervical Papanicolaou smear, unspecified abnormal pap finding History of abnormal Pap. Pap smear scheduled  5. Screen for colon cancer Patient has never had colonoscopy. She is scheduled - Ambulatory referral to Gastroenterology  6. Screening for breast cancer Due for mammogram - MM Digital Screening; Future  7. Post-menopausal Due for bone density - DG Bone Density; Future  She is also due for immunizations. She agrees to a tetanus today. She had a flu shot and Prevnar at her next visit in the fall  Patient Instructions  Labs due today Also due for mammogram and dexa scan Consult ordered to see about colonoscopy Continue same medicine Add lipitor at night  Come back next time for a physical and PAP     Eustace Moore, MD

## 2016-06-30 NOTE — Patient Instructions (Signed)
Labs due today Also due for mammogram and dexa scan Consult ordered to see about colonoscopy Continue same medicine Add lipitor at night  Come back next time for a physical and PAP

## 2016-07-01 LAB — TSH: TSH: 0.84 mIU/L

## 2016-07-01 LAB — HEMOGLOBIN A1C
Hgb A1c MFr Bld: 6.8 % — ABNORMAL HIGH (ref <5.7)
Mean Plasma Glucose: 148 mg/dL

## 2016-07-03 ENCOUNTER — Encounter: Payer: Self-pay | Admitting: Family Medicine

## 2016-07-11 ENCOUNTER — Telehealth: Payer: Self-pay | Admitting: Family Medicine

## 2016-07-11 NOTE — Telephone Encounter (Signed)
Patient said her arm is sore and she feels a knot since she received her tetanus  injection last week

## 2016-07-12 NOTE — Telephone Encounter (Signed)
Called and talked to Sabrina Thompson, states her arm " isnt doing it now" and she needs nothing from us.

## 2016-09-12 ENCOUNTER — Telehealth: Payer: Self-pay

## 2016-09-14 NOTE — Telephone Encounter (Signed)
Gastroenterology Pre-Procedure Review  Request Date: 09/12/2016 Requesting Physician:  Dr. Delton SeeNelson  PATIENT REVIEW QUESTIONS: The patient responded to the following health history questions as indicated:    1. Diabetes Melitis: YES 2. Joint replacements in the past 12 months: no 3. Major health problems in the past 3 months: no 4. Has an artificial valve or MVP: no 5. Has a defibrillator: no 6. Has been advised in past to take antibiotics in advance of a procedure like teeth cleaning: no 7. Family history of colon cancer: no  8. Alcohol Use: no 9. History of sleep apnea: no  10. History of coronary artery or other vascular stents placed within the last 12 months: no 11. History of any prior anesthesia complications: no    MEDICATIONS & ALLERGIES:    Patient reports the following regarding taking any blood thinners:   Plavix? no Aspirin? no Coumadin? no Brilinta? no Xarelto? no Eliquis? no Pradaxa? no Savaysa? no Effient? no  Patient confirms/reports the following medications:  Current Outpatient Prescriptions  Medication Sig Dispense Refill  . atorvastatin (LIPITOR) 20 MG tablet Take 1 tablet (20 mg total) by mouth daily. 90 tablet 3  . doxepin (SINEQUAN) 10 MG capsule Pt only takes it once a month  0  . FLUoxetine (PROZAC) 20 MG capsule Take 20 mg by mouth daily.    Marland Kitchen. glipiZIDE (GLUCOTROL) 5 MG tablet Take 1 tablet (5 mg total) by mouth 2 (two) times daily. 180 tablet 3  . ibuprofen (ADVIL,MOTRIN) 200 MG tablet Take 800 mg by mouth every 6 (six) hours as needed. Pain    . lisinopril-hydrochlorothiazide (PRINZIDE,ZESTORETIC) 20-25 MG tablet Take 1 tablet by mouth daily. 90 tablet 3  . Menthol-Methyl Salicylate (MUSCLE RUB) 10-15 % CREA Apply 1 application topically as needed for muscle pain.    . metFORMIN (GLUCOPHAGE) 1000 MG tablet Take 1 tablet (1,000 mg total) by mouth 2 (two) times daily with a meal. 180 tablet 3  . oxybutynin (DITROPAN) 5 MG tablet Take 1 tablet (5 mg  total) by mouth 2 (two) times daily. (Patient not taking: Reported on 09/12/2016) 60 tablet 11   No current facility-administered medications for this visit.     Patient confirms/reports the following allergies:  Allergies  Allergen Reactions  . Prednisone Hives    Also causes confusion    No orders of the defined types were placed in this encounter.   AUTHORIZATION INFORMATION Primary Insurance:  ID #: Group #: Pre-Cert / Auth required:  Pre-Cert / Auth #:   Secondary Insurance:  ID #:   Group #:  Pre-Cert / Auth required:  Pre-Cert / Auth #:   SCHEDULE INFORMATION: Procedure has been scheduled as follows:  Date:           Time:   Location:   This Gastroenterology Pre-Precedure Review Form is being routed to the following provider(s): Jonette EvaSandi Fields, MD

## 2016-09-14 NOTE — Telephone Encounter (Signed)
Ok to schedule. DM meds: half night before, none morning of. 

## 2016-09-19 ENCOUNTER — Other Ambulatory Visit: Payer: Self-pay

## 2016-09-19 DIAGNOSIS — Z1211 Encounter for screening for malignant neoplasm of colon: Secondary | ICD-10-CM

## 2016-09-19 MED ORDER — PEG 3350-KCL-NA BICARB-NACL 420 G PO SOLR: 4000.0000 mL | ORAL | 0 refills | Status: DC

## 2016-09-19 NOTE — Telephone Encounter (Signed)
Rx sent to the pharmacy and instructions mailed to pt.  

## 2016-09-20 NOTE — Telephone Encounter (Signed)
NO PA is needed for TCS 

## 2016-10-25 ENCOUNTER — Telehealth: Payer: Self-pay | Admitting: Gastroenterology

## 2016-10-25 NOTE — Telephone Encounter (Signed)
Pt called to cancel her procedure on 9/28 due to not having anyone to drive her to and from the hospital. I offered if she wanted to reschedule and she said not at this time.

## 2016-10-25 NOTE — Telephone Encounter (Signed)
Took off of the schedule. [facility] [place] [facility]. Sending FYI to Dr. Darrick PennaFields and Dr. Delton SeeNelson.

## 2016-10-26 NOTE — Telephone Encounter (Signed)
REVIEWED-NO ADDITIONAL RECOMMENDATIONS. 

## 2016-11-03 ENCOUNTER — Encounter: Payer: Self-pay | Admitting: Family Medicine

## 2016-11-03 ENCOUNTER — Ambulatory Visit (INDEPENDENT_AMBULATORY_CARE_PROVIDER_SITE_OTHER): Payer: BLUE CROSS/BLUE SHIELD | Admitting: Family Medicine

## 2016-11-03 VITALS — BP 136/84 | HR 70 | Temp 96.7°F | Resp 16 | Ht 66.0 in | Wt 193.0 lb

## 2016-11-03 DIAGNOSIS — J301 Allergic rhinitis due to pollen: Secondary | ICD-10-CM | POA: Diagnosis not present

## 2016-11-03 DIAGNOSIS — Z23 Encounter for immunization: Secondary | ICD-10-CM

## 2016-11-03 DIAGNOSIS — R42 Dizziness and giddiness: Secondary | ICD-10-CM

## 2016-11-03 DIAGNOSIS — E119 Type 2 diabetes mellitus without complications: Secondary | ICD-10-CM

## 2016-11-03 NOTE — Patient Instructions (Signed)
Drink plenty of water Stay as  Active as you can manage Take cetirizine or loratadine if needed for runny nose See me on usual schedule

## 2016-11-03 NOTE — Progress Notes (Signed)
Chief Complaint  Patient presents with  . Dizziness    x 1 on tuesday  here for acute visit Had a spell of dizziness 3 d ago that lasted about a minute.  Sat down and checked sugar - it was 180.  BP has been good.  Felt sensation of spinning.  Has had some clear rhinorrhea, more from left than right nostril.  No fever, no purulence, no sore throat or headache.  No ear pain or diminished hearing.  No head trauma.  No fainting.  Patient Active Problem List   Diagnosis Date Noted  . Abnormal Pap smear of cervix 06/30/2016  . 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 SUI (stress urinary incontinence, female) 03/31/2016  . Onychomycosis of toenail 03/31/2016  . Type 2 diabetes mellitus (HCC) 02/16/2016  . Benign essential HTN 02/16/2016  . HLD (hyperlipidemia) 02/16/2016  . Depression (emotion) 02/16/2016    Outpatient Encounter Prescriptions as of 11/03/2016  Medication Sig  . atorvastatin (LIPITOR) 20 MG tablet Take 1 tablet (20 mg total) by mouth daily.  Marland Kitchen doxepin (SINEQUAN) 10 MG capsule Pt only takes it once a month  . FLUoxetine (PROZAC) 20 MG capsule Take 20 mg by mouth daily.  Marland Kitchen glipiZIDE (GLUCOTROL) 5 MG tablet Take 1 tablet (5 mg total) by mouth 2 (two) times daily.  Marland Kitchen ibuprofen (ADVIL,MOTRIN) 200 MG tablet Take 800 mg by mouth every 6 (six) hours as needed. Pain  . lisinopril-hydrochlorothiazide (PRINZIDE,ZESTORETIC) 20-25 MG tablet Take 1 tablet by mouth daily.  . Menthol-Methyl Salicylate (MUSCLE RUB) 10-15 % CREA Apply 1 application topically as needed for muscle pain.  . metFORMIN (GLUCOPHAGE) 1000 MG tablet Take 1 tablet (1,000 mg total) by mouth 2 (two) times daily with a meal.  . polyethylene glycol-electrolytes (TRILYTE) 420 g solution Take 4,000 mLs by mouth as directed.   No facility-administered encounter medications on file as of 11/03/2016.     Allergies  Allergen Reactions  . Prednisone Hives    Also causes confusion    Review of  Systems  Constitutional: Negative for activity change, appetite change, fatigue and unexpected weight change.  HENT: Positive for rhinorrhea. Negative for congestion, ear pain, hearing loss, sinus pain, sinus pressure and sore throat.   Eyes: Negative for visual disturbance.  Respiratory: Negative for cough and shortness of breath.   Cardiovascular: Negative for chest pain and leg swelling.  Gastrointestinal: Negative for nausea and vomiting.  Neurological: Positive for dizziness. Negative for headaches.   See HPI   BP 136/84 (BP Location: Right Arm, Patient Position: Sitting, Cuff Size: Normal)   Pulse 70   Temp (!) 96.7 F (35.9 C) (Temporal)   Resp 16   Ht  (1.676 m)   Wt 193 lb (87.5 kg)   LMP 02/13/2005 (Approximate)   SpO2 98%   BMI 31.15 kg/m   Physical Exam  Constitutional: She is oriented to person, place, and time. She appears well-developed and well-nourished. No distress.  HENT:  Head: Normocephalic and atraumatic.  Right Ear: External ear normal.  Left Ear: External ear normal.  Nose: Nose normal.  Mouth/Throat: Oropharynx is clear and moist. No oropharyngeal exudate.  Eyes: Pupils are equal, round, and reactive to light. Conjunctivae are normal.  No nystagmus  Neck: Normal range of motion. Neck supple.  Cardiovascular: Normal rate, regular rhythm and normal heart sounds.   Pulmonary/Chest: Effort normal and breath sounds normal. No respiratory distress.  Musculoskeletal: Normal range of motion. She exhibits no  edema.  Lymphadenopathy:    She has no cervical adenopathy.  Neurological: She is alert and oriented to person, place, and time. She displays normal reflexes. No cranial nerve deficit. Coordination normal.  Psychiatric: She has a normal mood and affect. Her behavior is normal.    ASSESSMENT/PLAN:  1. Need for influenza vaccination - Flu Vaccine QUAD 36+ mos IM 2. Vertigo 3. Allergic rhinitis  Patient Instructions  Drink plenty of  water Stay as  Active as you can manage Take cetirizine or loratadine if needed for runny nose See me on usual schedule   Eustace Moore, MD

## 2016-11-10 ENCOUNTER — Ambulatory Visit (HOSPITAL_COMMUNITY): Admit: 2016-11-10 | Payer: BLUE CROSS/BLUE SHIELD | Admitting: Gastroenterology

## 2016-11-10 ENCOUNTER — Encounter (HOSPITAL_COMMUNITY): Payer: Self-pay

## 2016-11-10 SURGERY — COLONOSCOPY
Anesthesia: Moderate Sedation

## 2016-12-08 ENCOUNTER — Encounter: Payer: BLUE CROSS/BLUE SHIELD | Admitting: Family Medicine

## 2017-02-02 ENCOUNTER — Emergency Department (HOSPITAL_COMMUNITY): Payer: BLUE CROSS/BLUE SHIELD

## 2017-02-02 ENCOUNTER — Emergency Department (HOSPITAL_COMMUNITY)
Admission: EM | Admit: 2017-02-02 | Discharge: 2017-02-02 | Disposition: A | Discharge status: Home / Self Care | Payer: BLUE CROSS/BLUE SHIELD | Attending: Emergency Medicine | Admitting: Emergency Medicine

## 2017-02-02 ENCOUNTER — Encounter (HOSPITAL_COMMUNITY): Payer: Self-pay | Admitting: Emergency Medicine

## 2017-02-02 DIAGNOSIS — Z87891 Personal history of nicotine dependence: Secondary | ICD-10-CM | POA: Diagnosis not present

## 2017-02-02 DIAGNOSIS — R05 Cough: Secondary | ICD-10-CM | POA: Insufficient documentation

## 2017-02-02 DIAGNOSIS — F329 Major depressive disorder, single episode, unspecified: Secondary | ICD-10-CM | POA: Insufficient documentation

## 2017-02-02 DIAGNOSIS — E1165 Type 2 diabetes mellitus with hyperglycemia: Secondary | ICD-10-CM | POA: Insufficient documentation

## 2017-02-02 DIAGNOSIS — Z79899 Other long term (current) drug therapy: Secondary | ICD-10-CM | POA: Diagnosis not present

## 2017-02-02 DIAGNOSIS — E876 Hypokalemia: Secondary | ICD-10-CM | POA: Insufficient documentation

## 2017-02-02 DIAGNOSIS — R059 Cough, unspecified: Secondary | ICD-10-CM

## 2017-02-02 DIAGNOSIS — Z7984 Long term (current) use of oral hypoglycemic drugs: Secondary | ICD-10-CM | POA: Insufficient documentation

## 2017-02-02 DIAGNOSIS — F419 Anxiety disorder, unspecified: Secondary | ICD-10-CM | POA: Diagnosis not present

## 2017-02-02 DIAGNOSIS — E871 Hypo-osmolality and hyponatremia: Secondary | ICD-10-CM | POA: Insufficient documentation

## 2017-02-02 DIAGNOSIS — I10 Essential (primary) hypertension: Secondary | ICD-10-CM | POA: Diagnosis not present

## 2017-02-02 DIAGNOSIS — Z9049 Acquired absence of other specified parts of digestive tract: Secondary | ICD-10-CM | POA: Diagnosis not present

## 2017-02-02 DIAGNOSIS — R918 Other nonspecific abnormal finding of lung field: Secondary | ICD-10-CM | POA: Insufficient documentation

## 2017-02-02 LAB — BASIC METABOLIC PANEL
ANION GAP: 12 (ref 5–15)
BUN: 8 mg/dL (ref 6–20)
CHLORIDE: 87 mmol/L — AB (ref 101–111)
CO2: 27 mmol/L (ref 22–32)
Calcium: 9.6 mg/dL (ref 8.9–10.3)
Creatinine, Ser: 0.63 mg/dL (ref 0.44–1.00)
GFR calc Af Amer: >60 mL/min (ref >60)
GLUCOSE: 196 mg/dL — AB (ref 65–99)
POTASSIUM: 3.3 mmol/L — AB (ref 3.5–5.1)
Sodium: 126 mmol/L — ABNORMAL LOW (ref 135–145)

## 2017-02-02 LAB — CBG MONITORING, ED: Glucose-Capillary: 248 mg/dL — ABNORMAL HIGH (ref 65–99)

## 2017-02-02 MED ORDER — ALBUTEROL SULFATE HFA 108 (90 BASE) MCG/ACT IN AERS
2.0000 | INHALATION_SPRAY | RESPIRATORY_TRACT | Status: DC | PRN
  Administered 2017-02-02: 2 via RESPIRATORY_TRACT
  Filled 2017-02-02: qty 6.7

## 2017-02-02 MED ORDER — AEROCHAMBER PLUS W/MASK MISC
1.0000 | Freq: Once | Status: AC
  Administered 2017-02-02: 1
  Filled 2017-02-02: qty 1

## 2017-02-02 MED ORDER — POTASSIUM CHLORIDE CRYS ER 20 MEQ PO TBCR
40.0000 meq | EXTENDED_RELEASE_TABLET | Freq: Once | ORAL | Status: AC
  Administered 2017-02-02: 40 meq via ORAL
  Filled 2017-02-02: qty 2

## 2017-02-02 NOTE — Discharge Instructions (Signed)
You can use Robitussin as directed as well as your albuterol inhaler 2 puffs every 4 hours as needed for cough or shortness of breath.   You have a abnormality on your chest x-ray today in the left lung which is possibly a mass  or tumor.  Ask Dr. Delton SeeNelson to order a CT scan of the chest for you with intravenous contrast within the next 3 months to check you for cancer and return if concern for any reason

## 2017-02-02 NOTE — ED Provider Notes (Addendum)
[facility] EMERGENCY DEPARTMENT Provider Note   CSN: [id number] Arrival date & time: 02/02/17  [id number]     History   Chief Complaint Chief Complaint  Patient presents with  . Cough    HPI Sabrina Thompson is a 61 y.o. female.  HPI Complains of cough for 1 week.  Cough productive of whitish yellow sputum.  No shortness of breath.  Worse with standing and improved with sitting.  She also complained of left ear pain and pain on the left side of her throat for the past 2 days which resolved this morning.  She is treated herself with Tylenol sinus medicine.  She reports her glucose is been over 200 for the past few days.  She denies any vomiting or nausea.  Denies lightheadedness. no other associated symptoms. Past Medical History:  Diagnosis Date  . Allergy   . Anemia   . Anxiety   . 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 Depression   . Diabetes mellitus   . Hyperlipidemia   . Hypertension   . 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    Patient Active Problem List   Diagnosis Date Noted  . Abnormal Pap smear of cervix 06/30/2016  . 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 SUI (stress urinary incontinence, female) 03/31/2016  . Onychomycosis of toenail 03/31/2016  . Type 2 diabetes mellitus (HCC) 02/16/2016  . Benign essential HTN 02/16/2016  . HLD (hyperlipidemia) 02/16/2016  . Depression (emotion) 02/16/2016    Past Surgical History:  Procedure Laterality Date  . BACK SURGERY     lumbar surgery  . CHOLECYSTECTOMY  2005   [facility]_Dr Lovell SheehanJenkins  . DILATION AND CURETTAGE OF UTERUS  1981  . HERNIA REPAIR     umbilical  . INCISIONAL HERNIA REPAIR  09/22/2011   Procedure: HERNIA REPAIR INCISIONAL;  Surgeon: Dalia HeadingMark A Jenkins, MD;  Location: [facility];  Service: General;  Laterality: N/A;  . SPINE SURGERY  1998   disc injury    OB History    Gravida Para Term Preterm AB Living   2 1   1 1 1    SAB TAB Ectopic Multiple  Live Births   1               Home Medications    Prior to Admission medications   Medication Sig Start Date End Date Taking? Authorizing Provider  atorvastatin (LIPITOR) 20 MG tablet Take 1 tablet (20 mg total) by mouth daily. 06/30/16   Eustace MooreNelson, Yvonne Sue, MD  doxepin (SINEQUAN) 10 MG capsule Pt only takes it once a month 05/01/16   [provider]  FLUoxetine (PROZAC) 20 MG capsule Take 20 mg by mouth daily.    [provider]  glipiZIDE (GLUCOTROL) 5 MG tablet Take 1 tablet (5 mg total) by mouth 2 (two) times daily. 06/30/16   Eustace MooreNelson, Yvonne Sue, MD  ibuprofen (ADVIL,MOTRIN) 200 MG tablet Take 800 mg by mouth every 6 (six) hours as needed. Pain    [provider]  lisinopril-hydrochlorothiazide (PRINZIDE,ZESTORETIC) 20-25 MG tablet Take 1 tablet by mouth daily. 06/30/16   Eustace MooreNelson, Yvonne Sue, MD  Menthol-Methyl Salicylate (MUSCLE RUB) 10-15 % CREA Apply 1 application topically as needed for muscle pain.    [provider]  metFORMIN (GLUCOPHAGE) 1000 MG tablet Take 1 tablet (1,000 mg total) by mouth 2 (two) times daily with a meal. 06/30/16   Eustace MooreNelson, Yvonne Sue, MD  polyethylene glycol-electrolytes (  TRILYTE) 420 g solution Take 4,000 mLs by mouth as directed. 09/19/16   [place] BaliFields, Sandi L, MD    Family History Family History  Problem Relation Age of Onset  . Cancer Other   . Diabetes Other   . Arthritis Mother   . Depression Mother   . Diabetes Mother   . Heart disease Mother   . Miscarriages / [place] Mother   . Cancer Father        prostate  . Alcohol abuse Father   . Arthritis Father   . Diabetes Father   . Cancer Brother        lung  . Drug abuse Daughter        asddicted to pain pills    Social History Social History   Tobacco Use  . Smoking status: Former Smoker    Packs/day: 3.00    Years: 12.00    Pack years: 36.00    Types: Cigarettes    Last attempt to quit: 09/19/2008    Years since quitting: 8.3  . Smokeless tobacco:  Never Used  Substance Use Topics  . Alcohol use: No  . Drug use: No     Allergies   Prednisone   Review of Systems Review of Systems  Constitutional: Negative.   HENT: Positive for ear pain and sore throat.        Ear pain and sore throat have resolved  Respiratory: Positive for cough.   Cardiovascular: Negative.   Gastrointestinal: Negative.   Musculoskeletal: Negative.   Skin: Negative.   Allergic/Immunologic: Positive for immunocompromised state.  Neurological: Negative.   Psychiatric/Behavioral: Negative.   All other systems reviewed and are negative.    Physical Exam Updated Vital Signs BP (!) 150/101 (BP Location: Right Arm)   Pulse 90   Temp 98.2 F (36.8 C) (Oral)   Resp 16   Ht 5\' 6"  (1.676 m)   Wt 89.8 kg (198 lb)   LMP 02/13/2005 (Approximate)   SpO2 98%   BMI 31.96 kg/m   Physical Exam  Constitutional: She is oriented to person, place, and time. She appears well-developed and well-nourished. No distress.  HENT:  Head: Normocephalic and atraumatic.  Right Ear: External ear normal.  Left Ear: External ear normal.  Mouth/Throat: Oropharynx is clear and moist.  Bilateral tympanic membranes normal  Eyes: Conjunctivae are normal. Pupils are equal, round, and reactive to light.  Neck: Neck supple. No tracheal deviation present. No thyromegaly present.  Cardiovascular: Normal rate and regular rhythm.  No murmur heard. Pulmonary/Chest: Effort normal and breath sounds normal. No respiratory distress.  Abdominal: Soft. Bowel sounds are normal. She exhibits no distension. There is no tenderness.  Musculoskeletal: Normal range of motion. She exhibits no edema or tenderness.  Neurological: She is alert and oriented to person, place, and time. Coordination normal.  Gait normal not lightheaded on standing  Skin: Skin is warm and dry. No rash noted.  Psychiatric: She has a normal mood and affect.  Nursing note and vitals reviewed.    ED Treatments / Results   Labs (all labs ordered are listed, but only abnormal results are displayed) Labs Reviewed  CBG MONITORING, ED - Abnormal; Notable for the following components:      Result Value   Glucose-Capillary 248 (*)    All other components within normal limits  BASIC METABOLIC PANEL    EKG  EKG Interpretation None       Radiology No results found.  Procedures Procedures (including critical care time)  Medications Ordered in ED Medications - No data to display  Results for orders placed or performed during the hospital encounter of 02/02/17  Basic metabolic panel  Result Value Ref Range   Sodium 126 (L) 135 - 145 mmol/L   Potassium 3.3 (L) 3.5 - 5.1 mmol/L   Chloride 87 (L) 101 - 111 mmol/L   CO2 27 22 - 32 mmol/L   Glucose, Bld 196 (H) 65 - 99 mg/dL   BUN 8 6 - 20 mg/dL   Creatinine, Ser 4.09 0.44 - 1.00 mg/dL   Calcium 9.6 8.9 - 81.1 mg/dL   GFR calc non Af Amer >60 >60 mL/min   GFR calc Af Amer >60 >60 mL/min   Anion gap 12 5 - 15  CBG monitoring, ED  Result Value Ref Range   Glucose-Capillary 248 (H) 65 - 99 mg/dL   Dg Chest 2 View  Result Date: 02/02/2017 CLINICAL DATA:  Cough for over a week.  Former smoker. EXAM: CHEST  2 VIEW COMPARISON:  03/24/2013 FINDINGS: There is an irregular masslike opacity in the left mid lung measuring approximately 18 mm in size, new from the prior exam. Remainder of the lungs is clear. Cardiac silhouette is normal in size. No mediastinal or hilar masses or convincing adenopathy. No pleural effusion or pneumothorax. Skeletal structures are demineralized. There is an old healed right rib fracture. IMPRESSION: 1. No acute cardiopulmonary disease. 2. Focal masslike opacity in the left mid lung. Recommend follow-up chest CT with contrast for further assessment. Electronically Signed   By: Amie Portland M.D.   On: 02/02/2017 09:47   Initial Impression / Assessment and Plan / ED Course  I have reviewed the triage vital signs and the nursing  notes.  Pertinent labs & imaging results that were available during my care of the patient were reviewed by me and considered in my medical decision making (see chart for details).     10:20 AM resting comfortably.  Coughing occasionally.  Plan she will receive potassium chloride 40 mg orally prior to discharge.  Albuterol HFA with spacer to go to use 2 puffs every 4 hours as needed for cough or shortness of breath.  She does have Robitussin cough medicine at home.  She was offered a CT scan of her chest in the emergency department to evaluate abnormality on chest x-ray.  She prefers to have the test ordered by her primary care physician.  Final Clinical Impressions(s) / ED Diagnoses  Diagnoses #1 cough #2 hypokalemia #3 hyponatremia #4 hyperglycemia #5 abnormal chest x-ray Final diagnoses:  None    ED Discharge Orders    None       Doug Sou, MD 02/02/17 1053    Doug Sou, MD 02/02/17 1053

## 2017-02-02 NOTE — ED Notes (Signed)
Pt taken to xray 

## 2017-02-02 NOTE — ED Triage Notes (Signed)
Pt reports cough, congestion, left ear pain, and sore throat x 1 week.  Glucose has been over 200 for the past few days and has been compliant with meds.

## 2017-02-08 ENCOUNTER — Ambulatory Visit (INDEPENDENT_AMBULATORY_CARE_PROVIDER_SITE_OTHER): Payer: BLUE CROSS/BLUE SHIELD | Admitting: Family Medicine

## 2017-02-08 ENCOUNTER — Other Ambulatory Visit: Payer: Self-pay

## 2017-02-08 ENCOUNTER — Encounter: Payer: Self-pay | Admitting: Family Medicine

## 2017-02-08 VITALS — BP 122/70 | HR 88 | Temp 97.0°F | Resp 18 | Ht 66.0 in | Wt 189.1 lb

## 2017-02-08 DIAGNOSIS — Z599 Problem related to housing and economic circumstances, unspecified: Secondary | ICD-10-CM | POA: Insufficient documentation

## 2017-02-08 DIAGNOSIS — R918 Other nonspecific abnormal finding of lung field: Secondary | ICD-10-CM | POA: Diagnosis not present

## 2017-02-08 DIAGNOSIS — Z598 Other problems related to housing and economic circumstances: Secondary | ICD-10-CM

## 2017-02-08 DIAGNOSIS — J069 Acute upper respiratory infection, unspecified: Secondary | ICD-10-CM

## 2017-02-08 MED ORDER — AMOXICILLIN 500 MG PO CAPS: 500.0000 mg | ORAL_CAPSULE | Freq: Three times a day (TID) | ORAL | 0 refills | Status: DC

## 2017-02-08 MED ORDER — BENZONATATE 100 MG PO CAPS: 100.0000 mg | ORAL_CAPSULE | Freq: Two times a day (BID) | ORAL | 0 refills | Status: DC | PRN

## 2017-02-08 NOTE — Patient Instructions (Signed)
Take the antibiotic as directed Use the cough pill IN ADDITION to the mucinex Push fluids  We will call about the follow up x ray scan

## 2017-02-08 NOTE — Progress Notes (Signed)
Chief Complaint  Patient presents with  . URI    x 3 weeks  Patient went to the emergency room for cough on 02/02/2017.  It was determined she had a viral bronchitis.  Chest x-ray was performed that was abnormal.  Because of cost concerns, the patient would not agree to a CAT scan of her chest.  She wants to discuss this with me today.  Chest x-ray showed: FINDINGS: There is an irregular masslike opacity in the left mid lung measuring approximately 18 mm in size, new from the prior exam.  Remainder of the lungs is clear.  Cardiac silhouette is normal in size. No mediastinal or hilar masses or convincing adenopathy.  No pleural effusion or pneumothorax.  Skeletal structures are demineralized. There is an old healed right rib fracture.  IMPRESSION: 1. No acute cardiopulmonary disease. 2. Focal masslike opacity in the left mid lung. Recommend follow-up chest CT with contrast for further assessment.  Electronically Signed   By: Amie Portlandavid  Ormond M.D.   On: 02/02/2017 09:47  She is using over-the-counter Robitussin or Mucinex DM for the cough.  She continues to have a significant ongoing cough that is difficult to control.  In addition she is coughing up "chunks" of thick mucus.  She does not know that she has any COPD but she was a smoker for a long time.  She quit smoking 8 years ago.  I pulled up the picture of the chest x-ray and showed her the area that is considered abnormal.  I told her that she needs to have a CT scan for further evaluation.  I discussed with her calling her insurance company to find out what her estimated co-pay would be, and then setting up a payment plan to try to make it affordable.  He does not have hemoptysis, chest pain, weight loss.  Her major concern is her cough.  She remains unhappy that she was treated with an antibiotic.  I explained to her that by CDC guidelines coughs are usually viruses and not appropriate for antibiotic therapy unless they meet  certain criteria.  At this point, since she has been coughing for 3 weeks, and antibiotic trial is appropriate.  No promises that it will help her, this still could be a persistent viral cough. Patient Active Problem List   Diagnosis Date Noted  . Abnormal Pap smear of cervix 06/30/2016  . 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 SUI (stress urinary incontinence, female) 03/31/2016  . Onychomycosis of toenail 03/31/2016  . Type 2 diabetes mellitus (HCC) 02/16/2016  . Benign essential HTN 02/16/2016  . HLD (hyperlipidemia) 02/16/2016  . Depression (emotion) 02/16/2016    Outpatient Encounter Medications as of 02/08/2017  Medication Sig  . atorvastatin (LIPITOR) 20 MG tablet Take 1 tablet (20 mg total) by mouth daily.  Marland Kitchen. doxepin (SINEQUAN) 10 MG capsule Pt only takes it once a month  . FLUoxetine (PROZAC) 20 MG capsule Take 20 mg by mouth daily.  Marland Kitchen. glipiZIDE (GLUCOTROL) 5 MG tablet Take 1 tablet (5 mg total) by mouth 2 (two) times daily.  Marland Kitchen. ibuprofen (ADVIL,MOTRIN) 200 MG tablet Take 800 mg by mouth every 6 (six) hours as needed. Pain  . lisinopril-hydrochlorothiazide (PRINZIDE,ZESTORETIC) 20-25 MG tablet Take 1 tablet by mouth daily.  . Menthol-Methyl Salicylate (MUSCLE RUB) 10-15 % CREA Apply 1 application topically as needed for muscle pain.  . metFORMIN (GLUCOPHAGE) 1000 MG tablet Take 1 tablet (1,000 mg total) by mouth 2 (two) times  daily with a meal.  . Multiple Vitamins-Minerals (ONE-A-DAY WOMENS 50+ ADVANTAGE PO) Take 1 tablet by mouth daily.  Marland Kitchen Phenylephrine-DM-GG-APAP (TYLENOL COLD/FLU SEVERE PO) Take 2 tablets by mouth every 4 (four) hours.  . polyethylene glycol-electrolytes (TRILYTE) 420 g solution Take 4,000 mLs by mouth as directed.  Marland Kitchen amoxicillin (AMOXIL) 500 MG capsule Take 1 capsule (500 mg total) by mouth 3 (three) times daily.  . benzonatate (TESSALON) 100 MG capsule Take 1 capsule (100 mg total) by mouth 2 (two) times daily as needed for cough.    No facility-administered encounter medications on file as of 02/08/2017.     Allergies  Allergen Reactions  . Prednisone Hives    Also causes confusion    Review of Systems  Constitutional: Positive for fatigue. Negative for activity change, appetite change and unexpected weight change.  HENT: Negative for congestion, dental problem, postnasal drip and rhinorrhea.   Eyes: Negative for redness and visual disturbance.  Respiratory: Positive for cough and chest tightness. Negative for shortness of breath.   Cardiovascular: Negative for chest pain, palpitations and leg swelling.  Gastrointestinal: Negative for abdominal pain, constipation and diarrhea.  Genitourinary: Positive for urgency. Negative for difficulty urinating and frequency.       Increased incontinence with coughing  Musculoskeletal: Negative for arthralgias and back pain.  Neurological: Positive for headaches. Negative for dizziness.  Psychiatric/Behavioral: Positive for sleep disturbance. Negative for dysphoric mood. The patient is not nervous/anxious.     BP 122/70 (BP Location: Left Arm, Patient Position: Sitting, Cuff Size: Large)   Pulse 88   Temp (!) 97 F (36.1 C) (Temporal)   Resp 18   Ht 5\' 6"  (1.676 m)   Wt 189 lb 1.9 oz (85.8 kg)   LMP 02/13/2005 (Approximate)   SpO2 98%   BMI 30.52 kg/m   Physical Exam  Constitutional: She is oriented to person, place, and time. She appears well-developed and well-nourished.  Appears moderately ill, fatigued  HENT:  Head: Normocephalic and atraumatic.  Right Ear: External ear normal.  Left Ear: External ear normal.  Mouth/Throat: Oropharynx is clear and moist.  Poor dentition.  Mild congestion nasal turbinates.  Posterior pharynx benign  Eyes: Conjunctivae are normal. Pupils are equal, round, and reactive to light.  Neck: Normal range of motion. Neck supple. No thyromegaly present.  Anterior neck tender but no defined adenopathy  Cardiovascular: Normal rate,  regular rhythm and normal heart sounds.  Pulmonary/Chest: Effort normal and breath sounds normal. No respiratory distress.  No wheeze, few anterior rhonchi, no rales  Musculoskeletal: Normal range of motion. She exhibits no edema.  Lymphadenopathy:    She has no cervical adenopathy.  Neurological: She is alert and oriented to person, place, and time.  Gait normal  Skin: Skin is warm and dry.  Psychiatric: She has a normal mood and affect. Her behavior is normal. Thought content normal.  Nursing note and vitals reviewed.   ASSESSMENT/PLAN:  1. URI with cough and congestion Discussed viral versus bacterial infections.  Discussed appropriate use of antibiotics.  Prescribed amoxicillin and Tessalon.  Patient has concerns regarding cost, severe financial limitations.  We went on the website [employer] and got coupons to defray some of the cost  2. Mass of upper lobe of left lung And would order the CAT scan and see about getting preapproval.  She is to call her insurance company to see what her co-pay will be.  We did discuss a payment plan and the necessity to follow-up on a  lung mass especially with her prior history of smoking.  We discussed treating her infection in getting another chest x-ray in a month as a second choice, with the preferred  evaluation being CAT scan.  Patient Instructions  Take the antibiotic as directed Use the cough pill IN ADDITION to the mucinex Push fluids  We will call about the follow up x ray scan   Eustace MooreYvonne Sue Nelson, MD

## 2017-04-17 ENCOUNTER — Other Ambulatory Visit: Payer: Self-pay | Admitting: Family Medicine

## 2017-04-17 DIAGNOSIS — R918 Other nonspecific abnormal finding of lung field: Secondary | ICD-10-CM

## 2017-04-17 NOTE — Progress Notes (Unsigned)
Ct

## 2017-04-23 ENCOUNTER — Encounter: Payer: Self-pay | Admitting: Family Medicine

## 2017-04-25 ENCOUNTER — Telehealth: Payer: Self-pay

## 2017-04-25 ENCOUNTER — Other Ambulatory Visit: Payer: Self-pay

## 2017-04-25 DIAGNOSIS — E119 Type 2 diabetes mellitus without complications: Secondary | ICD-10-CM

## 2017-04-25 NOTE — Telephone Encounter (Signed)
Called and LVM advising patient of CT scheduling. March 27th at 1:00, check in time 12:45 at [facility]. Advised patient that they had to do a blood draw first thing before the test and to not have anything but water after 9:00 am that morning. Advised patient to call back with any questions.

## 2017-05-03 ENCOUNTER — Other Ambulatory Visit: Payer: Self-pay | Admitting: Family Medicine

## 2017-05-08 ENCOUNTER — Telehealth: Payer: Self-pay

## 2017-05-08 NOTE — Telephone Encounter (Signed)
 Spoke with Patsy LagerYolanda C at [place] from patients insurance company to get approval for CT w/ contrast scheduled for tomorrow. Imaging was approved.  Approval code : [id number]   Approved from 05/08/17-06/06/17.    - S.

## 2017-05-09 ENCOUNTER — Ambulatory Visit (HOSPITAL_COMMUNITY): Payer: BLUE CROSS/BLUE SHIELD

## 2017-05-22 ENCOUNTER — Other Ambulatory Visit (HOSPITAL_COMMUNITY): Payer: Self-pay | Admitting: Internal Medicine

## 2017-05-22 DIAGNOSIS — R911 Solitary pulmonary nodule: Secondary | ICD-10-CM

## 2017-06-09 ENCOUNTER — Other Ambulatory Visit: Payer: Self-pay | Admitting: Family Medicine

## 2017-06-11 ENCOUNTER — Encounter (HOSPITAL_COMMUNITY): Payer: Self-pay

## 2017-06-11 ENCOUNTER — Ambulatory Visit (HOSPITAL_COMMUNITY): Payer: BLUE CROSS/BLUE SHIELD

## 2017-06-18 ENCOUNTER — Ambulatory Visit (HOSPITAL_COMMUNITY): Payer: BLUE CROSS/BLUE SHIELD

## 2017-07-13 ENCOUNTER — Encounter (HOSPITAL_COMMUNITY): Payer: Self-pay

## 2017-07-13 ENCOUNTER — Ambulatory Visit (HOSPITAL_COMMUNITY): Admission: RE | Admit: 2017-07-13 | Payer: BLUE CROSS/BLUE SHIELD | Source: Ambulatory Visit

## 2017-07-18 ENCOUNTER — Other Ambulatory Visit: Payer: Self-pay | Admitting: Family Medicine

## 2017-10-26 ENCOUNTER — Other Ambulatory Visit: Payer: Self-pay | Admitting: Family Medicine

## 2017-11-22 ENCOUNTER — Encounter: Payer: Self-pay | Admitting: Family Medicine

## 2017-11-22 ENCOUNTER — Other Ambulatory Visit: Payer: Self-pay

## 2017-11-22 ENCOUNTER — Encounter (HOSPITAL_COMMUNITY): Payer: Self-pay | Admitting: Emergency Medicine

## 2017-11-22 ENCOUNTER — Emergency Department (HOSPITAL_COMMUNITY)
Admission: EM | Admit: 2017-11-22 | Discharge: 2017-11-22 | Disposition: A | Discharge status: Home / Self Care | Payer: Self-pay | Attending: Emergency Medicine | Admitting: Emergency Medicine

## 2017-11-22 DIAGNOSIS — E119 Type 2 diabetes mellitus without complications: Secondary | ICD-10-CM | POA: Insufficient documentation

## 2017-11-22 DIAGNOSIS — E7800 Pure hypercholesterolemia, unspecified: Secondary | ICD-10-CM

## 2017-11-22 DIAGNOSIS — R112 Nausea with vomiting, unspecified: Secondary | ICD-10-CM | POA: Insufficient documentation

## 2017-11-22 DIAGNOSIS — I10 Essential (primary) hypertension: Secondary | ICD-10-CM | POA: Insufficient documentation

## 2017-11-22 DIAGNOSIS — R197 Diarrhea, unspecified: Secondary | ICD-10-CM | POA: Insufficient documentation

## 2017-11-22 HISTORY — DX: Pure hypercholesterolemia, unspecified: E78.00

## 2017-11-22 HISTORY — DX: Type 2 diabetes mellitus without complications: E11.9

## 2017-11-22 LAB — CBC WITH DIFFERENTIAL/PLATELET
ABS IMMATURE GRANULOCYTES: 0.05 10*3/uL (ref 0.00–0.07)
BASOS ABS: 0 10*3/uL (ref 0.0–0.1)
Basophils Relative: 0 %
Eosinophils Absolute: 0 10*3/uL (ref 0.0–0.5)
Eosinophils Relative: 0 %
HCT: 43.9 % (ref 36.0–46.0)
Hemoglobin: 13.9 g/dL (ref 12.0–15.0)
IMMATURE GRANULOCYTES: 0 %
Lymphocytes Relative: 2 %
Lymphs Abs: 0.3 10*3/uL — ABNORMAL LOW (ref 0.7–4.0)
MCH: 28.4 pg (ref 26.0–34.0)
MCHC: 31.7 g/dL (ref 30.0–36.0)
MCV: 89.8 fL (ref 80.0–100.0)
MONOS PCT: 4 %
Monocytes Absolute: 0.5 10*3/uL (ref 0.1–1.0)
NEUTROS ABS: 11.8 10*3/uL — AB (ref 1.7–7.7)
NEUTROS PCT: 94 %
Platelets: 268 10*3/uL (ref 150–400)
RBC: 4.89 MIL/uL (ref 3.87–5.11)
RDW: 14 % (ref 11.5–15.5)
WBC: 12.7 10*3/uL — ABNORMAL HIGH (ref 4.0–10.5)
nRBC: 0 % (ref 0.0–0.2)

## 2017-11-22 LAB — COMPREHENSIVE METABOLIC PANEL
ALT: 29 U/L (ref 0–44)
ANION GAP: 15 (ref 5–15)
AST: 21 U/L (ref 15–41)
Albumin: 4.5 g/dL (ref 3.5–5.0)
Alkaline Phosphatase: 62 U/L (ref 38–126)
BILIRUBIN TOTAL: 1 mg/dL (ref 0.3–1.2)
BUN: 23 mg/dL (ref 8–23)
CO2: 26 mmol/L (ref 22–32)
Calcium: 9.4 mg/dL (ref 8.9–10.3)
Chloride: 93 mmol/L — ABNORMAL LOW (ref 98–111)
Creatinine, Ser: 0.73 mg/dL (ref 0.44–1.00)
GFR calc Af Amer: >60 mL/min (ref >60)
GFR calc non Af Amer: >60 mL/min (ref >60)
Glucose, Bld: 259 mg/dL — ABNORMAL HIGH (ref 70–99)
POTASSIUM: 3.5 mmol/L (ref 3.5–5.1)
SODIUM: 134 mmol/L — AB (ref 135–145)
Total Protein: 7.7 g/dL (ref 6.5–8.1)

## 2017-11-22 LAB — LIPASE, BLOOD: LIPASE: 44 U/L (ref 11–51)

## 2017-11-22 MED ORDER — SODIUM CHLORIDE 0.9 % IV BOLUS (SEPSIS)
1000.0000 mL | Freq: Once | INTRAVENOUS | Status: AC
  Administered 2017-11-22: 1000 mL via INTRAVENOUS

## 2017-11-22 MED ORDER — ONDANSETRON HCL 4 MG/2ML IJ SOLN
4.0000 mg | Freq: Once | INTRAMUSCULAR | Status: AC
  Administered 2017-11-22: 4 mg via INTRAVENOUS
  Filled 2017-11-22: qty 2

## 2017-11-22 NOTE — ED Provider Notes (Signed)
[facility] EMERGENCY DEPARTMENT Provider Note   CSN: [id number] Arrival date & time: 11/22/17  0135     History   Chief Complaint Chief Complaint  Patient presents with  . Emesis    HPI Sabrina Thompson is a 62 y.o. female.  The history is provided by the patient.  Emesis   This is a new problem. The current episode started 6 to 12 hours ago. The problem occurs more than 10 times per day. The problem has been rapidly worsening. The emesis has an appearance of stomach contents. There has been no fever. Associated symptoms include chills and diarrhea. Pertinent negatives include no abdominal pain and no cough.  Patient with history of diabetes and hypertension presents with vomiting.  She reports multiple episodes of vomiting over the past 6 hours.  It is nonbloody.  She reports nausea.  She reports upset stomach.  She is now developing nonbloody diarrhea.  No known sick contacts.  No chest pain or shortness of breath.  Past Medical History:  Diagnosis Date  . Diabetes mellitus without complication (HCC)   . Hypercholesterolemia 11/22/2017  . Hypertension     There are no active problems to display for this patient.   Past Surgical History:  Procedure Laterality Date  . BACK SURGERY  1998  . CHOLECYSTECTOMY  1987  . dnc  30  . HERNIA REPAIR  2003   umbilical      OB History   None      Home Medications    Prior to Admission medications   Not on File    Family History History reviewed. No pertinent family history.  Social History Social History   Tobacco Use  . Smoking status: Never Smoker  . Smokeless tobacco: Never Used  Substance Use Topics  . Alcohol use: Not Currently  . Drug use: Never     Allergies   Prednisone   Review of Systems Review of Systems  Constitutional: Positive for chills.  Respiratory: Negative for cough.   Gastrointestinal: Positive for diarrhea and vomiting. Negative for abdominal pain.  All other systems reviewed  and are negative.    Physical Exam Updated Vital Signs BP (!) 141/77 (BP Location: Right Arm)   Pulse 96   Temp 97.9 F (36.6 C) (Oral)   Resp 18   Ht 1.676 m (5\' 6" )   Wt 83.5 kg   SpO2 97%   BMI 29.70 kg/m   Physical Exam CONSTITUTIONAL: Well developed/well nourished HEAD: Normocephalic/atraumatic EYES: EOMI/PERRL, no icterus ENMT: Mucous membranes dry NECK: supple no meningeal signs SPINE/BACK:entire spine nontender CV: S1/S2 noted, no murmurs/rubs/gallops noted LUNGS: Lungs are clear to auscultation bilaterally, no apparent distress ABDOMEN: soft, nontender, no rebound or guarding, bowel sounds noted throughout abdomen GU:no cva tenderness NEURO: Pt is awake/alert/appropriate, moves all extremitiesx4.  No facial droop.   EXTREMITIES: pulses normal/equal, full ROM SKIN: warm, color normal PSYCH: no abnormalities of mood noted, alert and oriented to situation   ED Treatments / Results  Labs (all labs ordered are listed, but only abnormal results are displayed) Labs Reviewed  COMPREHENSIVE METABOLIC PANEL - Abnormal; Notable for the following components:      Result Value   Sodium 134 (*)    Chloride 93 (*)    Glucose, Bld 259 (*)    All other components within normal limits  CBC WITH DIFFERENTIAL/PLATELET - Abnormal; Notable for the following components:   WBC 12.7 (*)    Neutro Abs 11.8 (*)    Lymphs  Abs 0.3 (*)    All other components within normal limits  LIPASE, BLOOD    EKG EKG Interpretation  Date/Time:  Thursday November 22 2017 03:57:35 EDT Ventricular Rate:  90 PR Interval:    QRS Duration: 98 QT Interval:  379 QTC Calculation: 464 R Axis:   -35 Text Interpretation:  Sinus rhythm Atrial premature complexes Left axis deviation Low voltage, precordial leads Anteroseptal infarct, old No previous ECGs available Abnormal ekg Confirmed by Zadie Rhine ([id number]) on 11/22/2017 4:02:15 AM   Radiology No results found.  Procedures Procedures    Medications Ordered in ED Medications  sodium chloride 0.9 % bolus 1,000 mL (0 mLs Intravenous Stopped 11/22/17 0521)  ondansetron (ZOFRAN) injection 4 mg (4 mg Intravenous Given 11/22/17 0450)  sodium chloride 0.9 % bolus 1,000 mL (1,000 mLs Intravenous New Bag/Given 11/22/17 0653)  ondansetron (ZOFRAN) injection 4 mg (4 mg Intravenous Given 11/22/17 0653)     Initial Impression / Assessment and Plan / ED Course  I have reviewed the triage vital signs and the nursing notes.  Pertinent labs  results that were available during my care of the patient were reviewed by me and considered in my medical decision making (see chart for details).     7:38 AM Patient present with nausea/vomiting/diarrhea.  She had no focal abdominal tenderness on repeat exam.  She was mildly dehydrated but otherwise well-appearing.  She is now taking p.o. fluids.  Suspect viral illness cause.  She feels comfortable for discharge home.  Final Clinical Impressions(s) / ED Diagnoses   Final diagnoses:  Nausea vomiting and diarrhea    ED Discharge Orders    None       Zadie Rhine, MD 11/22/17 [phone]

## 2017-11-22 NOTE — Discharge Instructions (Addendum)

## 2017-11-22 NOTE — ED Notes (Signed)
Pt given water to drink. 

## 2017-11-22 NOTE — ED Triage Notes (Signed)
Pt c/o 15-20 episodes of emesis since 2000 last night with nausea and chills, denies diarrhea, pt has not taken temp-unsure of fever

## 2018-04-09 DIAGNOSIS — Z139 Encounter for screening, unspecified: Secondary | ICD-10-CM

## 2018-04-09 LAB — GLUCOSE, POCT (MANUAL RESULT ENTRY): POC Glucose: 165 mg/dl — AB (ref 70–99)

## 2018-04-09 NOTE — Congregational Nurse Program (Signed)
  Dept: [phone]   Congregational Nurse Program Note  Date of Encounter: 04/09/2018  Past Medical History: Past Medical History:  Diagnosis Date  . Allergy   . Anemia   . Anxiety   . 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 Depression   . Diabetes mellitus   . Diabetes mellitus without complication (HCC)   . Hypercholesterolemia 11/22/2017  . Hyperlipidemia   . Hypertension   . 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    Encounter Details: CNP Questionnaire - 04/09/18 1506      Questionnaire   Patient Status  Not Applicable  (Pended)     Race  White or Caucasian  (Pended)     Location Patient Served At  [employer]  (Pended)     Insurance  Not Applicable  (Pended)        Pt presents to clinic for screening referral for PCP; she states she would like to see PCP as it relates to dental care (loose tooth) and for Diabetes and BP Management.   States she has been taken her meds that were recently refilled by her PCP at the [facility].  States she was unable to pick up her refill for the "cholesterol" meds due to it being to expensive.     Vitals signs checked: Random Blood Glucose checked - last ate 12pm.     Referred pt to RN Case Manager, Jolaine Artist for post visit followup and case managerment.     Appt is set for 04/12/18 at 1;45pm at [facility];

## 2018-10-17 ENCOUNTER — Encounter: Payer: Self-pay | Attending: Nurse Practitioner | Admitting: Nutrition

## 2018-10-17 ENCOUNTER — Encounter: Payer: Self-pay | Admitting: Nutrition

## 2018-10-17 ENCOUNTER — Other Ambulatory Visit: Payer: Self-pay

## 2018-10-17 VITALS — Ht 66.0 in | Wt 200.8 lb

## 2018-10-17 DIAGNOSIS — IMO0002 Reserved for concepts with insufficient information to code with codable children: Secondary | ICD-10-CM

## 2018-10-17 DIAGNOSIS — E118 Type 2 diabetes mellitus with unspecified complications: Secondary | ICD-10-CM | POA: Insufficient documentation

## 2018-10-17 DIAGNOSIS — E1165 Type 2 diabetes mellitus with hyperglycemia: Secondary | ICD-10-CM | POA: Insufficient documentation

## 2018-10-17 NOTE — Progress Notes (Signed)
  Medical Nutrition Therapy:  Appt start time: 1500 end time:  1600.   Assessment:  Primary concerns today: Diabetes Type 2. Lives by herself. Had DM x 20 yrs ago. Sees PCP at [facility]Marland Kitchen  Has a drop foot.  Testing blood sugars  FBS: 130-155 mg/dl Eating 3 meals per day. Feels like she is doing better Limited mobility due to drop foot. Working to get off [place]. Has stopped drinking diet sodas. Had a 54 mg/bs  BS of last week.   Lab Results  Component Value Date   HGBA1C 6.8 (H) 06/30/2016   CMP Latest Ref Rng & Units 11/22/2017 02/02/2017 06/30/2016  Glucose 70 - 99 mg/dL 259(H) 196(H) 185(H)  BUN 8 - 23 mg/dL 23 8 10   Creatinine 0.44 - 1.00 mg/dL 0.73 0.63 0.63  Sodium 135 - 145 mmol/L 134(L) 126(L) 134(L)  Potassium 3.5 - 5.1 mmol/L 3.5 3.3(L) 4.0  Chloride 98 - 111 mmol/L 93(L) 87(L) 95(L)  CO2 22 - 32 mmol/L 26 27 30   Calcium 8.9 - 10.3 mg/dL 9.4 9.6 9.4  Total Protein 6.5 - 8.1 g/dL 7.7 - 6.6  Total Bilirubin 0.3 - 1.2 mg/dL 1.0 - 0.3  Alkaline Phos 38 - 126 U/L 62 - 59  AST 15 - 41 U/L 21 - 20  ALT 0 - 44 U/L 29 - 30(H)     Preferred Learning Style:    No preference indicated   Learning Readiness:    Ready  Change in progress   MEDICATIONS: see list   DIETARY INTAKE:  24-hr recall:  B ( AM): 2 cups coffee, block cheese, 15 grapes, and carrot slices,  Snk ( AM):  L ( PM): [place] sandwich with lettuce,  Snk ( PM): water D ( PM): Tacos 2, flour, cantelope, water Snk ( PM): Beverages:water,   Usual physical activity: ADL   Estimated energy needs: 1200  calories 135 g carbohydrates 90 g protein 33 g fat  Progress Towards Goal(s):  In progress.   Nutritional Diagnosis:  NB-1.1 Food and nutrition-related knowledge deficit As related to Diabetes and Obesity.  As evidenced by A1C > 6.5% and BMI > 30.    Intervention: Nutrition and Diabetes education provided on My Plate, CHO counting, meal planning, portion sizes, timing of meals, avoiding snacks  between meals unless having a low blood sugar, target ranges for A1C and blood sugars, signs/symptoms and treatment of hyper/hypoglycemia, monitoring blood sugars, taking medications as prescribed, benefits of exercising 30 minutes per day and prevention of complications of DM.  Goals  Follow My Plate Eat three meals per day Cut out snacks and processed food Increase fresh fruits and vegetables Don't skip meals.   Teaching Method Utilized:  Visual Auditory Hands on  Handouts given during visit include:  The Plate Method   Meal Plan Card  Diabetes Instructions   Barriers to learning/adherence to lifestyle change:  Demonstrated degree of understanding via:  Teach Back   Monitoring/Evaluation:  Dietary intake, exercise,  and body weight in 1 month(s).

## 2018-11-13 ENCOUNTER — Encounter: Payer: Self-pay | Admitting: Nutrition

## 2018-11-13 NOTE — Patient Instructions (Signed)
  Goals  Follow My Plate Eat three meals per day Cut out snacks and processed food Increase fresh fruits and vegetables Don't skip meals.

## 2018-12-03 ENCOUNTER — Encounter: Payer: Self-pay | Attending: Physician Assistant | Admitting: Nutrition

## 2018-12-03 ENCOUNTER — Other Ambulatory Visit: Payer: Self-pay

## 2018-12-03 ENCOUNTER — Encounter: Payer: Self-pay | Admitting: Nutrition

## 2018-12-03 VITALS — Ht 66.0 in | Wt 195.4 lb

## 2018-12-03 DIAGNOSIS — E1165 Type 2 diabetes mellitus with hyperglycemia: Secondary | ICD-10-CM | POA: Insufficient documentation

## 2018-12-03 DIAGNOSIS — E118 Type 2 diabetes mellitus with unspecified complications: Secondary | ICD-10-CM | POA: Insufficient documentation

## 2018-12-03 DIAGNOSIS — IMO0002 Reserved for concepts with insufficient information to code with codable children: Secondary | ICD-10-CM

## 2018-12-03 NOTE — Patient Instructions (Signed)
Follow up 3 months  Eat meals on time 30 grams of carbs per meal  Cut out snacks Don't eat past 7 pm. Try char exercise daily Get A1C 6.5% or below Don't  Skip meals

## 2018-12-03 NOTE — Progress Notes (Signed)
  Medical Nutrition Therapy:  Appt start time: 1500 end time:  1600.   Assessment:  Primary concerns today: Diabetes Type 2. Lives by herself. Had DM x 20 yrs ago. Sees PCP at [facility].Marland Kitchen  Has a drop foot.  Last A1C  133-182  Evenings    Lost 5 lbs. Has cut out sodas,  Testing blood sugars  FBS:  mg/dl Eating 3 meals per day. Feels like she is doing better Limited mobility due to drop foot. Working to get off [place]. Has stopped drinking diet sodas. Had a 54 mg/dl s  BS of last week.   Lab Results  Component Value Date   HGBA1C 6.8 (H) 06/30/2016   CMP Latest Ref Rng & Units 11/22/2017 02/02/2017 06/30/2016  Glucose 70 - 99 mg/dL 259(H) 196(H) 185(H)  BUN 8 - 23 mg/dL 23 8 10   Creatinine 0.44 - 1.00 mg/dL 0.73 0.63 0.63  Sodium 135 - 145 mmol/L 134(L) 126(L) 134(L)  Potassium 3.5 - 5.1 mmol/L 3.5 3.3(L) 4.0  Chloride 98 - 111 mmol/L 93(L) 87(L) 95(L)  CO2 22 - 32 mmol/L 26 27 30   Calcium 8.9 - 10.3 mg/dL 9.4 9.6 9.4  Total Protein 6.5 - 8.1 g/dL 7.7 - 6.6  Total Bilirubin 0.3 - 1.2 mg/dL 1.0 - 0.3  Alkaline Phos 38 - 126 U/L 62 - 59  AST 15 - 41 U/L 21 - 20  ALT 0 - 44 U/L 29 - 30(H)     Preferred Learning Style:    No preference indicated   Learning Readiness:    Ready  Change in progress   MEDICATIONS: see list   DIETARY INTAKE: recall 24-hr :  B ( AM): hc 2 co[pd  Or eggs and [employer] toast and fruit, Snk ( AM):  L ( PM): skipped  Not feelign well D ( PM): cube steak, sqush and n onionsSnk ( PM): Beverages:water,   Usual physical activity: ADL   Estimated energy needs: 1200  calories 135 g carbohydrates 90 g protein 33 g fat  Progress Towards Goal(s):  In progress.   Nutritional Diagnosis:  NB-1.1 Food and nutrition-related knowledge deficit As related to Diabetes and Obesity.  As evidenced by A1C > 6.5% and BMI > 30.    Intervention: Nutrition and Diabetes education provided on My Plate, CHO counting, meal planning, portion sizes, timing of meals,  avoiding snacks between meals unless having a low blood sugar, target ranges for A1C and blood sugars, signs/symptoms and treatment of hyper/hypoglycemia, monitoring blood sugars, taking medications as prescribed, benefits of exercising 30 minutes per day and prevention of complications of DM. Follow up 3 months  Eat meals on time 30 grams of carbs per meal  Cut out snacks Don't eat past 7 pm. Try char exercise daily Get A1C 6.5% or below Don't  Skip meals    Teaching Method Utilized:  Visual Auditory Hands on  Handouts given during visit include:  The Plate Method   Meal Plan Card  Diabetes Instructions   Barriers to learning/adherence to lifestyle change:  Demonstrated degree of understanding via:  Teach Back   Monitoring/Evaluation:  Dietary intake, exercise,  and body weight in 3 month(s).

## 2019-03-06 ENCOUNTER — Encounter: Payer: Self-pay | Attending: Physician Assistant | Admitting: Nutrition

## 2019-03-06 ENCOUNTER — Encounter: Payer: Self-pay | Admitting: Nutrition

## 2019-03-06 ENCOUNTER — Other Ambulatory Visit: Payer: Self-pay

## 2019-03-06 VITALS — Ht 66.0 in | Wt 191.0 lb

## 2019-03-06 DIAGNOSIS — IMO0002 Reserved for concepts with insufficient information to code with codable children: Secondary | ICD-10-CM

## 2019-03-06 DIAGNOSIS — E119 Type 2 diabetes mellitus without complications: Secondary | ICD-10-CM | POA: Insufficient documentation

## 2019-03-06 DIAGNOSIS — E669 Obesity, unspecified: Secondary | ICD-10-CM | POA: Insufficient documentation

## 2019-03-06 DIAGNOSIS — E782 Mixed hyperlipidemia: Secondary | ICD-10-CM | POA: Insufficient documentation

## 2019-03-06 DIAGNOSIS — E1165 Type 2 diabetes mellitus with hyperglycemia: Secondary | ICD-10-CM

## 2019-03-06 NOTE — Patient Instructions (Addendum)
Goals Keep up the great job!  Cut out simple sugars, excess fat and saltty foods Increase fresh fruits and vegetables Drink 5 bottles of water or 92-120 oz of water per day Avoid snacks between meals or after supper Eat 30 g CHO per meal Avoid processed and high salt foods Cut out splenda Exercise as able Get FBS lo 130 mg/dl and less 375 at bedtime. Lose 2-3 lbs per month

## 2019-03-06 NOTE — Progress Notes (Signed)
  Medical Nutrition Therapy:  Appt start time: 1100 end time: 1130   Assessment:  Primary concerns today: Diabetes Type 2. Lives by herself. Had DM x 20 yrs ago. Sees PCP at [facility].Marland Kitchen  Has a drop foot. Lost 4 lbs.  Last A1C 6.6%. Actos, GLipizide 10 mg and 1000 mg BID of Metformin FBS:  170-200's in am and Evenings    Lost 5 lbs. Has cut out sodas,  Testing blood sugars  FBS:  Mg/dl  Doing much better.    Lab Results  Component Value Date   HGBA1C 6.8 (H) 06/30/2016   CMP Latest Ref Rng & Units 11/22/2017 02/02/2017 06/30/2016  Glucose 70 - 99 mg/dL 027(X) 412(I) 786(V)  BUN 8 - 23 mg/dL 23 8 10   Creatinine 0.44 - 1.00 mg/dL 6.72 0.94  Sodium 135 - 145 mmol/L 134(L) 126(L) 134(L)  Potassium 3.5 - 5.1 mmol/L 3.5 3.3(L) 4.0  Chloride 98 - 111 mmol/L 93(L) 87(L) 95(L)  CO2 22 - 32 mmol/L 26 27 30   Calcium 8.9 - 10.3 mg/dL 9.4 9.6 9.4  Total Protein 6.5 - 8.1 g/dL 7.7 - 6.6  Total Bilirubin 0.3 - 1.2 mg/dL 1.0 - 0.3  Alkaline Phos 38 - 126 U/L 62 - 59  AST 15 - 41 U/L 21 - 20  ALT 0 - 44 U/L 29 - 30(H)     Preferred Learning Style:    No preference indicated   Learning Readiness:    Ready  Change in progress   MEDICATIONS: see list   DIETARY INTAKE: recall 24-hr :  B ( AM): Chunk of cheese, chips, coffee-splenda and coffee mate Snk ( AM):  L ( PM): Roast and egg noodles 2 cups, water D ( PM): Yogurt, ham slice, water Snk ( PM): a lemon Beverages:water,   Usual physical activity: ADL   Estimated energy needs: 1200  calories 135 g carbohydrates 90 g protein 33 g fat  Progress Towards Goal(s):  In progress.   Nutritional Diagnosis:  NB-1.1 Food and nutrition-related knowledge deficit As related to Diabetes and Obesity.  As evidenced by A1C > 6.5% and BMI > 30.    Intervention: Nutrition and Diabetes education provided on My Plate, CHO counting, meal planning, portion sizes, timing of meals, avoiding snacks between meals unless having a low blood  sugar, target ranges for A1C and blood sugars, signs/symptoms and treatment of hyper/hypoglycemia, monitoring blood sugars, taking medications as prescribed, benefits of exercising 30 minutes per day and prevention of complications of DM.  High  fiber foods.   Goals  Cut out simple sugars, excess fat and saltty foods Increase fresh fruits and vegetables Drink 5 bottles of water or 92-120 oz of water per day Avoid snacks between meals or after supper Eat 30 g CHO per meal Avoid processed and high salt foods Cut out splenda Exercise as able Get FBS lo 130 mg/dl and less 7.09 at bedtime. Lose 2-3 lbs per month     Teaching Method Utilized:  Visual Auditory Hands on  Handouts given during visit include:  The Plate Method   Meal Plan Card  Diabetes Instructions   Barriers to learning/adherence to lifestyle change:  Demonstrated degree of understanding via:  Teach Back   Monitoring/Evaluation:  Dietary intake, exercise,  and body weight in 3 month(s).

## 2019-03-24 ENCOUNTER — Encounter: Payer: Self-pay | Admitting: Nutrition

## 2019-06-10 ENCOUNTER — Ambulatory Visit: Payer: Self-pay | Admitting: Nutrition

## 2019-06-17 ENCOUNTER — Other Ambulatory Visit (HOSPITAL_COMMUNITY): Payer: Self-pay | Admitting: *Deleted

## 2019-06-17 DIAGNOSIS — Z1231 Encounter for screening mammogram for malignant neoplasm of breast: Secondary | ICD-10-CM

## 2019-07-21 ENCOUNTER — Ambulatory Visit (HOSPITAL_COMMUNITY)
Admission: RE | Admit: 2019-07-21 | Discharge: 2019-07-21 | Disposition: A | Discharge status: Home / Self Care | Payer: PRIVATE HEALTH INSURANCE | Source: Ambulatory Visit | Attending: *Deleted | Admitting: *Deleted

## 2019-07-21 ENCOUNTER — Other Ambulatory Visit: Payer: Self-pay

## 2019-07-21 DIAGNOSIS — Z1231 Encounter for screening mammogram for malignant neoplasm of breast: Secondary | ICD-10-CM

## 2019-09-05 ENCOUNTER — Other Ambulatory Visit: Payer: Self-pay

## 2019-09-05 ENCOUNTER — Other Ambulatory Visit (HOSPITAL_COMMUNITY): Payer: Self-pay | Admitting: Physician Assistant

## 2019-09-05 ENCOUNTER — Ambulatory Visit (HOSPITAL_COMMUNITY)
Admission: RE | Admit: 2019-09-05 | Discharge: 2019-09-05 | Disposition: A | Discharge status: Home / Self Care | Payer: Self-pay | Source: Ambulatory Visit | Attending: Physician Assistant | Admitting: Physician Assistant

## 2019-09-05 DIAGNOSIS — M79672 Pain in left foot: Secondary | ICD-10-CM | POA: Insufficient documentation

## 2019-12-15 ENCOUNTER — Other Ambulatory Visit: Payer: Self-pay

## 2019-12-15 ENCOUNTER — Ambulatory Visit
Admission: RE | Admit: 2019-12-15 | Discharge: 2019-12-15 | Disposition: A | Discharge status: Home / Self Care | Payer: Self-pay | Source: Ambulatory Visit | Attending: Emergency Medicine | Admitting: Emergency Medicine

## 2019-12-15 VITALS — BP 143/82 | HR 96 | Temp 99.0°F | Resp 19 | Ht 66.0 in

## 2019-12-15 DIAGNOSIS — R6889 Other general symptoms and signs: Secondary | ICD-10-CM

## 2019-12-15 DIAGNOSIS — J069 Acute upper respiratory infection, unspecified: Secondary | ICD-10-CM

## 2019-12-15 DIAGNOSIS — Z1152 Encounter for screening for COVID-19: Secondary | ICD-10-CM

## 2019-12-15 MED ORDER — BENZONATATE 100 MG PO CAPS: 100.0000 mg | ORAL_CAPSULE | Freq: Three times a day (TID) | ORAL | 0 refills | Status: DC

## 2019-12-15 MED ORDER — TRIAMCINOLONE ACETONIDE 55 MCG/ACT NA AERO: 2.0000 | INHALATION_SPRAY | Freq: Every day | NASAL | 12 refills | Status: DC

## 2019-12-15 NOTE — ED Provider Notes (Signed)
[facility]   [id number] 12/15/19 Arrival Time: 1238   CC: COVID symptoms  SUBJECTIVE: History from: patient.  Sabrina Thompson is a 64 y.o. female presented to the urgent care for complaint of chills, fever, fatigue, cough, decreased appetite and body aches for the past 1 week.  Denies sick exposure to COVID, flu or strep.  Denies recent travel.  Has tried OTC Mucinex without relief.  Denies aggravating factors.  Denies previous symptoms in the past.   Denies fever, chills, fatigue, sinus pain, rhinorrhea, sore throat, SOB, wheezing, chest pain, nausea, changes in bowel or bladder habits.     ROS: As per HPI.  All other pertinent ROS negative.     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   Depression    Diabetes mellitus    Diabetes mellitus without complication (HCC)    Hypercholesterolemia 11/22/2017   Hyperlipidemia    Hypertension    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   Past Surgical History:  Procedure Laterality Date   BACK SURGERY     lumbar surgery   BACK SURGERY  1998   CHOLECYSTECTOMY  2005   [facility]_Dr Lovell Sheehan   CHOLECYSTECTOMY  1987   DILATION AND CURETTAGE OF UTERUS  1981   dnc  1981   HERNIA REPAIR     umbilical   HERNIA REPAIR  2003   umbilical    INCISIONAL HERNIA REPAIR  09/22/2011   Procedure: HERNIA REPAIR INCISIONAL;  Surgeon: Dalia Heading, MD;  Location: [facility];  Service: General;  Laterality: N/A;   SPINE SURGERY  1998   disc injury   Allergies  Allergen Reactions   Prednisone Hives    Also causes confusion   Prednisone Other (See Comments)    Disoriented and hives    No current facility-administered medications on file prior to encounter.   Current Outpatient Medications on File Prior to Encounter  Medication Sig Dispense Refill   amoxicillin (AMOXIL) 500 MG capsule Take 1 capsule (500 mg  total) by mouth 3 (three) times daily. (Patient not taking: Reported on 04/09/2018) 30 capsule 0   atorvastatin (LIPITOR) 20 MG tablet Take 1 tablet (20 mg total) by mouth daily. (Patient not taking: Reported on 04/09/2018) 90 tablet 3   cyclobenzaprine (FLEXERIL) 5 MG tablet Take 5 mg by mouth at bedtime.     doxepin (SINEQUAN) 10 MG capsule Pt only takes it once a month  0   FLUoxetine (PROZAC) 20 MG capsule Take 20 mg by mouth daily.     glipiZIDE (GLUCOTROL) 5 MG tablet Take 1 tablet (5 mg total) by mouth 2 (two) times daily. 180 tablet 3   ibuprofen (ADVIL,MOTRIN) 200 MG tablet Take 800 mg by mouth every 6 (six) hours as needed. Pain     lisinopril-hydrochlorothiazide (PRINZIDE,ZESTORETIC) 20-25 MG tablet TAKE 1 TABLET BY MOUTH ONCE DAILY 90 tablet 0   Menthol-Methyl Salicylate (MUSCLE RUB) 10-15 % CREA Apply 1 application topically as needed for muscle pain.     metFORMIN (GLUCOPHAGE) 1000 MG tablet TAKE 1 TABLET BY MOUTH TWICE DAILY WITH A MEAL 180 tablet 0   Multiple Vitamins-Minerals (ONE-A-DAY WOMENS 50+ ADVANTAGE PO) Take 1 tablet by mouth daily.     Phenylephrine-DM-GG-APAP (TYLENOL COLD/FLU SEVERE PO) Take 2 tablets by mouth every 4 (four) hours.     pioglitazone (ACTOS) 15 MG  tablet Take 15 mg by mouth daily.     polyethylene glycol-electrolytes (TRILYTE) 420 g solution Take 4,000 mLs by mouth as directed. (Patient not taking: Reported on 04/09/2018) 4000 mL 0   traZODone (DESYREL) 100 MG tablet Take 50 mg by mouth at bedtime.     Social History   Socioeconomic History   Marital status: Married    Spouse name: Not on file   Number of children: 1   Years of education: 12   Highest education level: Not on file  Occupational History   Occupation: Conservation officer, nature    Comment: Science writer  Tobacco Use   Smoking status: Former Smoker    Years: 3.00    Types: Cigarettes   Smokeless tobacco: Never Used  Building services engineer Use: Never used  Substance and Sexual  Activity   Alcohol use: Not Currently   Drug use: Never   Sexual activity: Not Currently    Birth control/protection: Post-menopausal  Other Topics Concern   Not on file  Social History Narrative   ** Merged History Encounter **       Lives alone, rents a room from a friend    Social Determinants of Health   Financial Resource Strain:    Difficulty of Paying Living Expenses: Not on file  Food Insecurity:    Worried About Programme researcher, broadcasting/film/video in the Last Year: Not on file   [employer] of Food in the Last Year: Not on file  Transportation Needs:    Lack of Transportation (Medical): Not on file   Lack of Transportation (Non-Medical): Not on file  Physical Activity:    Days of Exercise per Week: Not on file   Minutes of Exercise per Session: Not on file  Stress:    Feeling of Stress : Not on file  Social Connections:    Frequency of Communication with Friends and Family: Not on file   Frequency of Social Gatherings with Friends and Family: Not on file   Attends Religious Services: Not on file   Active Member of Clubs or Organizations: Not on file   Attends Banker Meetings: Not on file   Marital Status: Not on file  Intimate Partner Violence:    Fear of Current or Ex-Partner: Not on file   Emotionally Abused: Not on file   Physically Abused: Not on file   Sexually Abused: Not on file   Family History  Problem Relation Age of Onset   Cancer Other    Diabetes Other    Arthritis Mother    Depression Mother    Diabetes Mother    Heart disease Mother    Miscarriages / [place] Mother    Cancer Father        prostate   Alcohol abuse Father    Arthritis Father    Diabetes Father    Cancer Brother        lung   Drug abuse Daughter        asddicted to pain pills    OBJECTIVE:  Vitals:   12/15/19 1252  BP: (!) 143/82  Pulse: 96  Resp: 19  Temp: 99 F (37.2 C)  TempSrc: Oral  SpO2: 94%  Height: 5\' 6"  (1.676 m)       General appearance: alert; appears fatigued, but nontoxic; speaking in full sentences and tolerating own secretions HEENT: NCAT; Ears: EACs clear, TMs pearly gray; Eyes: PERRL.  EOM grossly intact. Sinuses: nontender; Nose: nares patent without rhinorrhea, Throat: oropharynx  clear, tonsils non erythematous or enlarged, uvula midline  Neck: supple without LAD Lungs: unlabored respirations, symmetrical air entry; cough: moderate; no respiratory distress; CTAB Heart: regular rate and rhythm.  Radial pulses 2+ symmetrical bilaterally Skin: warm and dry Psychological: alert and cooperative; normal mood and affect  LABS:  No results found for this or any previous visit (from the past 24 hour(s)).   ASSESSMENT & PLAN:  1. Viral URI with cough   2. Encounter for screening for COVID-19   3. Feeling unwell     Meds ordered this encounter  Medications   benzonatate (TESSALON) 100 MG capsule    Sig: Take 1 capsule (100 mg total) by mouth every 8 (eight) hours.    Dispense:  30 capsule    Refill:  0   triamcinolone (NASACORT) 55 MCG/ACT AERO nasal inhaler    Sig: Place 2 sprays into the nose daily.    Dispense:  1 each    Refill:  12    Discharge instructions  COVID testing ordered.  It will take between 2-7 days for test results.  Someone will contact you regarding abnormal results.    In the meantime: You should remain isolated in your home for 10 days from symptom onset AND greater than 72 hours after symptoms resolution (absence of fever without the use of fever-reducing medication and improvement in respiratory symptoms), whichever is longer Get plenty of rest and push fluids Tessalon Perles prescribed for cough Nasacort for nasal congestion and runny nose Use medications daily for symptom relief Use OTC medications like ibuprofen or tylenol as needed fever or pain Call or go to the ED if you have any new or worsening symptoms such as fever, worsening cough, shortness of  breath, chest tightness, chest pain, turning blue, changes in mental status, etc...   Reviewed expectations re: course of current medical issues. Questions answered. Outlined signs and symptoms indicating need for more acute intervention. Patient verbalized understanding. After Visit Summary given.         Durward Parcel, FNP 12/15/19 1350

## 2019-12-15 NOTE — ED Triage Notes (Addendum)
Pt has been sick since Tuesday, has had a fever on and off.  Decreased appetite. Wed-fri reports coughing and sneezing.  Aching to bilateral shoulders and fatigue.  Pt has been taking mucinex with no relief.

## 2019-12-15 NOTE — Discharge Instructions (Addendum)
COVID testing ordered.  It will take between 2-7 days for test results.  Someone will contact you regarding abnormal results.    In the meantime: You should remain isolated in your home for 10 days from symptom onset AND greater than 72 hours after symptoms resolution (absence of fever without the use of fever-reducing medication and improvement in respiratory symptoms), whichever is longer Get plenty of rest and push fluids Tessalon Perles prescribed for cough Nasacort for nasal congestion and runny nose Use medications daily for symptom relief Use OTC medications like ibuprofen or tylenol as needed fever or pain Call or go to the ED if you have any new or worsening symptoms such as fever, worsening cough, shortness of breath, chest tightness, chest pain, turning blue, changes in mental status, etc..Marland Kitchen

## 2019-12-16 LAB — COVID-19, FLU A+B AND RSV
Influenza A, NAA: NOT DETECTED
Influenza B, NAA: NOT DETECTED
RSV, NAA: NOT DETECTED
SARS-CoV-2, NAA: NOT DETECTED

## 2020-08-18 DIAGNOSIS — N3281 Overactive bladder: Secondary | ICD-10-CM | POA: Diagnosis not present

## 2020-08-18 DIAGNOSIS — G629 Polyneuropathy, unspecified: Secondary | ICD-10-CM | POA: Diagnosis not present

## 2020-08-18 DIAGNOSIS — E1165 Type 2 diabetes mellitus with hyperglycemia: Secondary | ICD-10-CM | POA: Insufficient documentation

## 2020-08-18 DIAGNOSIS — F5104 Psychophysiologic insomnia: Secondary | ICD-10-CM | POA: Insufficient documentation

## 2020-08-18 DIAGNOSIS — L84 Corns and callosities: Secondary | ICD-10-CM | POA: Diagnosis not present

## 2020-08-18 DIAGNOSIS — I10 Essential (primary) hypertension: Secondary | ICD-10-CM | POA: Diagnosis not present

## 2020-08-18 DIAGNOSIS — M21372 Foot drop, left foot: Secondary | ICD-10-CM | POA: Insufficient documentation

## 2020-08-18 DIAGNOSIS — B351 Tinea unguium: Secondary | ICD-10-CM | POA: Diagnosis not present

## 2020-08-18 DIAGNOSIS — E782 Mixed hyperlipidemia: Secondary | ICD-10-CM | POA: Diagnosis not present

## 2020-08-27 ENCOUNTER — Other Ambulatory Visit: Payer: Self-pay

## 2020-08-27 ENCOUNTER — Other Ambulatory Visit: Payer: Self-pay | Admitting: Podiatry

## 2020-08-27 ENCOUNTER — Ambulatory Visit (INDEPENDENT_AMBULATORY_CARE_PROVIDER_SITE_OTHER): Payer: Medicare Other

## 2020-08-27 ENCOUNTER — Other Ambulatory Visit (HOSPITAL_COMMUNITY): Payer: Self-pay | Admitting: Internal Medicine

## 2020-08-27 ENCOUNTER — Ambulatory Visit (INDEPENDENT_AMBULATORY_CARE_PROVIDER_SITE_OTHER): Payer: Medicare Other | Admitting: Podiatry

## 2020-08-27 DIAGNOSIS — M2042 Other hammer toe(s) (acquired), left foot: Secondary | ICD-10-CM | POA: Diagnosis not present

## 2020-08-27 DIAGNOSIS — M2041 Other hammer toe(s) (acquired), right foot: Secondary | ICD-10-CM

## 2020-08-27 DIAGNOSIS — M21372 Foot drop, left foot: Secondary | ICD-10-CM

## 2020-08-27 DIAGNOSIS — M79671 Pain in right foot: Secondary | ICD-10-CM

## 2020-08-27 DIAGNOSIS — Z1231 Encounter for screening mammogram for malignant neoplasm of breast: Secondary | ICD-10-CM

## 2020-08-27 DIAGNOSIS — E1142 Type 2 diabetes mellitus with diabetic polyneuropathy: Secondary | ICD-10-CM

## 2020-08-27 DIAGNOSIS — M79672 Pain in left foot: Secondary | ICD-10-CM

## 2020-09-01 ENCOUNTER — Ambulatory Visit (HOSPITAL_COMMUNITY): Payer: Self-pay

## 2020-09-03 NOTE — Progress Notes (Signed)
  Subjective:  Patient ID: Sabrina Thompson, female    DOB: 14-Oct-1955,  MRN: [id number]  Chief Complaint  Patient presents with   Hammer Toe    Bilateral hammertoes   Callouses    Bilateral callouses   Toe Pain    Left 3rd toe rough spot    65 y.o. female presents with the above complaint. History confirmed with patient.   Also has hx of dropfoot never used a brace for this. Has fears of falling.  Objective:  Physical Exam: warm, good capillary refill, nail exam normal nails without lesions, no trophic changes or ulcerative lesions. DP pulses palpable, PT pulses palpable, and protective sensation absent Left Foot: hammertoe deformities noted DIPJ callus left 3rd toe; dropfoot left Right Foot: hammertoe deformities noted   No images are attached to the encounter.  Assessment:   1. Hammertoes of both feet   2. DM type 2 with diabetic peripheral neuropathy (HCC)   3. Left foot drop   4. Hammer toes of both feet    Plan:  Patient was evaluated and treated and all questions answered.  Hammertoe, Diabetes, and DPN -Patient is diabetic with a qualifying condition for at risk foot care. -Educated on DM Footcare. -Would benefit from DM shoes.  Procedure: Paring of Lesion Rationale: painful hyperkeratotic lesion Type of Debridement: manual, sharp debridement. Instrumentation: 312 blade Number of Lesions: 1  Dropfoot Left -Would benefit from dropfoot brace will make appt for fabrication.  Return in about 3 months (around 11/27/2020) for Diabetic Foot Care.

## 2020-09-23 DIAGNOSIS — I10 Essential (primary) hypertension: Secondary | ICD-10-CM | POA: Diagnosis not present

## 2020-09-23 DIAGNOSIS — E119 Type 2 diabetes mellitus without complications: Secondary | ICD-10-CM | POA: Diagnosis not present

## 2020-09-29 DIAGNOSIS — N3281 Overactive bladder: Secondary | ICD-10-CM | POA: Diagnosis not present

## 2020-09-29 DIAGNOSIS — G629 Polyneuropathy, unspecified: Secondary | ICD-10-CM | POA: Diagnosis not present

## 2020-09-29 DIAGNOSIS — R5383 Other fatigue: Secondary | ICD-10-CM | POA: Diagnosis not present

## 2020-09-29 DIAGNOSIS — M21372 Foot drop, left foot: Secondary | ICD-10-CM | POA: Diagnosis not present

## 2020-09-29 DIAGNOSIS — E782 Mixed hyperlipidemia: Secondary | ICD-10-CM | POA: Diagnosis not present

## 2020-09-29 DIAGNOSIS — Z0001 Encounter for general adult medical examination with abnormal findings: Secondary | ICD-10-CM | POA: Diagnosis not present

## 2020-09-29 DIAGNOSIS — L298 Other pruritus: Secondary | ICD-10-CM | POA: Diagnosis not present

## 2020-09-29 DIAGNOSIS — L84 Corns and callosities: Secondary | ICD-10-CM | POA: Diagnosis not present

## 2020-09-29 DIAGNOSIS — E871 Hypo-osmolality and hyponatremia: Secondary | ICD-10-CM | POA: Diagnosis not present

## 2020-10-01 ENCOUNTER — Other Ambulatory Visit: Payer: Self-pay

## 2020-10-01 ENCOUNTER — Other Ambulatory Visit: Payer: Medicare Other

## 2020-10-04 ENCOUNTER — Telehealth: Payer: Self-pay | Admitting: Podiatry

## 2020-10-04 ENCOUNTER — Ambulatory Visit (HOSPITAL_COMMUNITY): Payer: Self-pay

## 2020-10-04 NOTE — Telephone Encounter (Signed)
Called pt to see if we could get a copy of her [facility] medicare card so I can verify benefits for the brace we are going to order for her.  Pt stated it does not take effect until sept 1st. I have pt sending me a copy to my phone so I can print it and call to verify benefits and if authorization is needed.

## 2020-10-05 DIAGNOSIS — Z23 Encounter for immunization: Secondary | ICD-10-CM | POA: Diagnosis not present

## 2020-10-06 ENCOUNTER — Other Ambulatory Visit: Payer: Self-pay

## 2020-10-06 ENCOUNTER — Ambulatory Visit (HOSPITAL_COMMUNITY)
Admission: RE | Admit: 2020-10-06 | Discharge: 2020-10-06 | Disposition: A | Discharge status: Home / Self Care | Payer: Medicare Other | Source: Ambulatory Visit | Attending: Internal Medicine | Admitting: Internal Medicine

## 2020-10-06 DIAGNOSIS — Z1231 Encounter for screening mammogram for malignant neoplasm of breast: Secondary | ICD-10-CM | POA: Insufficient documentation

## 2020-10-08 ENCOUNTER — Telehealth: Payer: Self-pay | Admitting: Podiatry

## 2020-10-08 NOTE — Telephone Encounter (Signed)
Per Angie @ [facility] cpt [phone] for brace is valid and billable and covered @ 80%. No Berkley Harvey is needed. Pt will have 20% coinsurance... ref # [id number].Marland KitchenMarland Kitchen

## 2020-11-08 ENCOUNTER — Other Ambulatory Visit: Payer: Medicare Other

## 2020-11-26 ENCOUNTER — Other Ambulatory Visit: Payer: Medicare Other

## 2020-11-26 ENCOUNTER — Other Ambulatory Visit (INDEPENDENT_AMBULATORY_CARE_PROVIDER_SITE_OTHER): Payer: Self-pay

## 2020-11-26 ENCOUNTER — Other Ambulatory Visit: Payer: Self-pay

## 2020-11-26 DIAGNOSIS — E1142 Type 2 diabetes mellitus with diabetic polyneuropathy: Secondary | ICD-10-CM | POA: Diagnosis not present

## 2020-11-26 DIAGNOSIS — M21372 Foot drop, left foot: Secondary | ICD-10-CM | POA: Diagnosis not present

## 2020-12-03 ENCOUNTER — Ambulatory Visit (INDEPENDENT_AMBULATORY_CARE_PROVIDER_SITE_OTHER): Payer: Medicare Other | Admitting: Podiatry

## 2020-12-03 ENCOUNTER — Encounter: Payer: Self-pay | Admitting: Podiatry

## 2020-12-03 ENCOUNTER — Ambulatory Visit: Payer: Medicare Other | Admitting: Podiatry

## 2020-12-03 ENCOUNTER — Other Ambulatory Visit: Payer: Self-pay

## 2020-12-03 DIAGNOSIS — L84 Corns and callosities: Secondary | ICD-10-CM

## 2020-12-03 DIAGNOSIS — M2042 Other hammer toe(s) (acquired), left foot: Secondary | ICD-10-CM | POA: Diagnosis not present

## 2020-12-03 DIAGNOSIS — M2041 Other hammer toe(s) (acquired), right foot: Secondary | ICD-10-CM | POA: Diagnosis not present

## 2020-12-03 DIAGNOSIS — M21372 Foot drop, left foot: Secondary | ICD-10-CM | POA: Diagnosis not present

## 2020-12-03 DIAGNOSIS — M79675 Pain in left toe(s): Secondary | ICD-10-CM | POA: Diagnosis not present

## 2020-12-03 DIAGNOSIS — B351 Tinea unguium: Secondary | ICD-10-CM | POA: Diagnosis not present

## 2020-12-03 DIAGNOSIS — M79674 Pain in right toe(s): Secondary | ICD-10-CM

## 2020-12-03 DIAGNOSIS — E1142 Type 2 diabetes mellitus with diabetic polyneuropathy: Secondary | ICD-10-CM

## 2020-12-03 NOTE — Progress Notes (Signed)
This patient returns to my office for at risk foot care.  This patient requires this care by a professional since this patient will be at risk due to having type 2 diabetes.  Patient has been diagnosed with hammer toes  both feet and left foot drop.  This patient is unable to cut nails herself since the patient cannot reach her nails.These nails are painful walking and wearing shoes.  This patient presents for at risk foot care today.  General Appearance  Alert, conversant and in no acute stress.  Vascular  Dorsalis pedis and posterior tibial  pulses are palpable  bilaterally.  Capillary return is within normal limits  bilaterally. Temperature is within normal limits  bilaterally.  Neurologic  Senn-Weinstein monofilament wire test within normal limits  bilaterally. Muscle power within normal limits bilaterally.  Nails Thick disfigured discolored nails with subungual debris  from hallux to fifth toes bilaterally. No evidence of bacterial infection or drainage bilaterally.  Orthopedic  No limitations of motion  feet .  No crepitus or effusions noted.  No bony pathology or digital deformities noted.  Skin  normotropic skin with no porokeratosis noted bilaterally.  No signs of infections or ulcers noted.   Callus sub 1 left foot.  Onychomycosis  Pain in right toes  Pain in left toes  Callus left forefoot.  Hammer toes  B/l.  Consent was obtained for treatment procedures.   Mechanical debridement of nails 1-5  bilaterally performed with a nail nipper.  Filed with dremel without incident. Discussed hammer toes with this patient.  She is interested in hammer toe surgery due to painful toes.  Told her to make an appointment with Dr.  Samuella Cota.   Return office visit    3 months                 Told patient to return for periodic foot care and evaluation due to potential at risk complications.   Helane Gunther DPM

## 2021-01-04 ENCOUNTER — Telehealth: Payer: Self-pay | Admitting: Podiatry

## 2021-01-04 NOTE — Telephone Encounter (Signed)
Pt called about brace not working for her and I scheduled her to see Arlys John. She also asked about the diabetic shoes that were ordered when the brace was done and upon checking we have faxed it to pcp several times and have not gotten it back. Pt is to call to ask pcp office about it and I have refaxed it today to make sure they have it.

## 2021-01-05 ENCOUNTER — Telehealth: Payer: Self-pay | Admitting: Podiatry

## 2021-01-05 NOTE — Telephone Encounter (Signed)
Pt left message yesterday stating she called the pcp office and they said they faxed over the documents on 10.20 but there fax number has changed and to call her and she will give me the new fax number.  I returned call and pt gave me the new number and I have faxed it to that number atten to [place].

## 2021-01-12 ENCOUNTER — Ambulatory Visit: Payer: Medicare Other

## 2021-01-13 ENCOUNTER — Ambulatory Visit: Payer: Medicare Other

## 2021-01-18 ENCOUNTER — Other Ambulatory Visit: Payer: Medicare Other

## 2021-01-25 ENCOUNTER — Ambulatory Visit: Payer: Medicare Other

## 2021-01-25 ENCOUNTER — Other Ambulatory Visit: Payer: Self-pay

## 2021-01-25 DIAGNOSIS — M21372 Foot drop, left foot: Secondary | ICD-10-CM

## 2021-01-25 DIAGNOSIS — E1142 Type 2 diabetes mellitus with diabetic polyneuropathy: Secondary | ICD-10-CM

## 2021-01-25 NOTE — Progress Notes (Signed)
SITUATION Reason for Consult: Follow-up with left solid AFO Patient / Caregiver Report: Patient reports she has pain at her medial and lateral malleolei and the brace is pinching her calf  OBJECTIVE DATA History / Diagnosis: DM type 2 with diabetic peripheral neuropathy (HCC)  Left foot drop  Change in Pathology: Increase in heel cord tightness  ACTIONS PERFORMED Patient's equipment was checked for structural stability and fit. Device's spine was heated and trimlines flared to increase clearance of bony prominences. Foot plate was trimmed to fit shoe. Device(s) intact and fit is excellent. All questions answered and concerns addressed.  PLAN Follow-up as needed (PRN). Plan of care discussed with and agreed upon by patient / caregiver.

## 2021-02-24 DIAGNOSIS — I10 Essential (primary) hypertension: Secondary | ICD-10-CM | POA: Diagnosis not present

## 2021-02-24 DIAGNOSIS — E1165 Type 2 diabetes mellitus with hyperglycemia: Secondary | ICD-10-CM | POA: Diagnosis not present

## 2021-03-03 DIAGNOSIS — M2040 Other hammer toe(s) (acquired), unspecified foot: Secondary | ICD-10-CM | POA: Insufficient documentation

## 2021-03-09 ENCOUNTER — Ambulatory Visit: Payer: Medicare Other | Admitting: Podiatry

## 2021-03-18 ENCOUNTER — Ambulatory Visit: Payer: Medicare Other | Admitting: Podiatry

## 2021-03-21 ENCOUNTER — Ambulatory Visit: Payer: Medicare Other | Admitting: Podiatry

## 2021-03-29 ENCOUNTER — Ambulatory Visit (INDEPENDENT_AMBULATORY_CARE_PROVIDER_SITE_OTHER): Payer: Medicare Other | Admitting: Podiatry

## 2021-03-29 ENCOUNTER — Encounter: Payer: Self-pay | Admitting: Podiatry

## 2021-03-29 ENCOUNTER — Other Ambulatory Visit: Payer: Self-pay

## 2021-03-29 DIAGNOSIS — M2041 Other hammer toe(s) (acquired), right foot: Secondary | ICD-10-CM | POA: Diagnosis not present

## 2021-03-29 DIAGNOSIS — B351 Tinea unguium: Secondary | ICD-10-CM | POA: Diagnosis not present

## 2021-03-29 DIAGNOSIS — E1169 Type 2 diabetes mellitus with other specified complication: Secondary | ICD-10-CM

## 2021-03-29 DIAGNOSIS — M2042 Other hammer toe(s) (acquired), left foot: Secondary | ICD-10-CM | POA: Diagnosis not present

## 2021-03-29 DIAGNOSIS — E1142 Type 2 diabetes mellitus with diabetic polyneuropathy: Secondary | ICD-10-CM

## 2021-03-29 NOTE — Progress Notes (Signed)
°  Subjective:  Patient ID: Sabrina Thompson, female    DOB: 08-May-1955,  MRN: [id number]  Chief Complaint  Patient presents with   Nail Problem    I am here to get the toenails trimmed and they are thick and discolored   Foot Pain    I want to talk about getting surgery for the hammertoes   66 y.o. female presents with the above complaint. History confirmed with patient. Cannot wear the brace due to the toes of hte left foot, specifically the 1st toe. Has pain in her hammerteos and would like to discuss intervention - left are worse than right.  Objective:  Physical Exam: warm, good capillary refill, nail exam normal nails without lesions, no trophic changes or ulcerative lesions. DP pulses palpable, PT pulses palpable, and protective sensation absent Left Foot: hammertoe deformities noted; dropfoot left. 2nd toe rigid deformity, hallux IPJ exostosis noted. 3rd-5th toes reducible. Right Foot: hammertoe deformities noted   No images are attached to the encounter.  Assessment:   1. 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 Hammertoes of both feet     Plan:  Patient was evaluated and treated and all questions answered.  Hammertoe, Diabetes, and DPN -Patient is diabetic with a qualifying condition for at risk foot care.  Procedure: Nail Debridement Type of Debridement: manual, sharp debridement. Instrumentation: Nail nipper, rotary burr. Number of Nails: 10 Disposition: Patient tolerated well without iatrogenic injury.    Dropfoot Left -Unable to wear brace given hallux exostosis. She may benefit from arthroplasty to allow wearing of brace  Hammertoes left foot -Discussed she would benefit from flexor tenotomy 3rd/4th toes left foot. I do not think 2nd toe would work - and for this toe and 1st toe would likely need arthroplasty. Will start with left foot and then can proceed with right foot later if indicated.  Return in  about 3 weeks (around 04/19/2021).

## 2021-04-19 ENCOUNTER — Ambulatory Visit: Payer: Medicare Other | Admitting: Podiatry

## 2021-04-19 DIAGNOSIS — J029 Acute pharyngitis, unspecified: Secondary | ICD-10-CM | POA: Diagnosis not present

## 2021-04-19 DIAGNOSIS — R0981 Nasal congestion: Secondary | ICD-10-CM | POA: Diagnosis not present

## 2021-04-19 DIAGNOSIS — H6690 Otitis media, unspecified, unspecified ear: Secondary | ICD-10-CM | POA: Diagnosis not present

## 2021-04-20 ENCOUNTER — Ambulatory Visit: Payer: Medicare Other | Admitting: Podiatry

## 2021-04-27 ENCOUNTER — Encounter: Payer: Self-pay | Admitting: Podiatry

## 2021-04-27 ENCOUNTER — Ambulatory Visit (INDEPENDENT_AMBULATORY_CARE_PROVIDER_SITE_OTHER): Payer: Medicare Other | Admitting: Podiatry

## 2021-04-27 ENCOUNTER — Other Ambulatory Visit: Payer: Self-pay

## 2021-04-27 DIAGNOSIS — E1142 Type 2 diabetes mellitus with diabetic polyneuropathy: Secondary | ICD-10-CM

## 2021-04-27 DIAGNOSIS — M2041 Other hammer toe(s) (acquired), right foot: Secondary | ICD-10-CM

## 2021-04-27 DIAGNOSIS — M21372 Foot drop, left foot: Secondary | ICD-10-CM

## 2021-04-27 DIAGNOSIS — M2042 Other hammer toe(s) (acquired), left foot: Secondary | ICD-10-CM

## 2021-04-27 NOTE — Progress Notes (Signed)
?  Subjective:  ?Patient ID: Sabrina Thompson, female    DOB: [birth year],  MRN: [id number] ? ?Chief Complaint  ?Patient presents with  ? Diabetes  ? Nail Problem  ? Hammer Toe  ?   flexor tenotomy procedure/WAS DR PRICE PT(DB SHOES IN 03/07)  ? ?66 y.o. female presents with the above complaint. History confirmed with patient. Cannot wear the brace due to the toes of hte left foot, specifically the 1st toe. Has pain in her hammerteos and would like to discuss intervention - left are worse than right. ? ?Objective:  ?Physical Exam: warm, good capillary refill, nail exam normal nails without lesions, no trophic changes or ulcerative lesions. DP pulses palpable, PT pulses palpable, and protective sensation absent ?Left Foot: hammertoe deformities noted; dropfoot left. 2nd toe rigid deformity, hallux IPJ exostosis noted. 3rd-5th toes reducible. ?Right Foot: hammertoe deformities noted  ? ?No images are attached to the encounter. ? ?Assessment:  ? ?1. Hammertoes of both feet   ?2. DM type 2 with diabetic peripheral neuropathy (HCC)   ?3. Left foot drop   ? ? ?Plan:  ?Patient was evaluated and treated and all questions answered. ? ?Hammertoe, Diabetes, and DPN ? ?Dropfoot Left ?-Unable to wear brace given hallux exostosis. She may benefit from arthroplasty to allow wearing of brace at some point.  ? ?Hammertoes left foot ?-Discussed she would benefit from flexor tenotomy 3rd/4th toes left foot. I do not think 2nd toe would work - and for this toe and 1st toe would likely need arthroplasty. Will start with left foot and then can proceed with right foot later if indicated. Tenotomy procedure below  ? ?Procedure: Flexor Tenotomy ?Indication for Procedure: toe with semi-reducible hammertoe with distal tip ulceration. Flexor tenotomy indicated to alleviate contracture, reduce pressure, and enhance healing of the ulceration. ?Location: left, 2nd toe, 3rd toe ?Anesthesia: Lidocaine 1% plain; 65mL digital block ?Instrumentation: 18  gauge needle  ?Technique: The toe was anesthetized as above and prepped in the usual fashion. The toe was exanquinated and a tourniquet was secured at the base of the toe. A 18 gauge needle was then used to make a transverse incision over the plantar aspect of the distal interphalangeal joint. The flexor tendon was incised with noted release of the hammertoe deformity. The incision was then irrigated and dressed with sterile dressing. Patient tolerated the procedure well. ?Dressing: Dry, sterile, compression dressing. ?Disposition: Patient tolerated procedure well. Patient to return in 1 week for follow-up. ? ? ?Return in about 1 week (around 05/04/2021) for post op.  ?

## 2021-05-04 ENCOUNTER — Ambulatory Visit (INDEPENDENT_AMBULATORY_CARE_PROVIDER_SITE_OTHER): Payer: Medicare Other | Admitting: Podiatry

## 2021-05-04 ENCOUNTER — Ambulatory Visit (INDEPENDENT_AMBULATORY_CARE_PROVIDER_SITE_OTHER): Payer: Medicare Other

## 2021-05-04 ENCOUNTER — Other Ambulatory Visit: Payer: Self-pay

## 2021-05-04 DIAGNOSIS — E1142 Type 2 diabetes mellitus with diabetic polyneuropathy: Secondary | ICD-10-CM

## 2021-05-04 DIAGNOSIS — M2042 Other hammer toe(s) (acquired), left foot: Secondary | ICD-10-CM

## 2021-05-04 DIAGNOSIS — M2041 Other hammer toe(s) (acquired), right foot: Secondary | ICD-10-CM

## 2021-05-04 DIAGNOSIS — L84 Corns and callosities: Secondary | ICD-10-CM

## 2021-05-04 NOTE — Progress Notes (Signed)
SITUATION ?Reason for Visit: Fitting of Diabetic Shoes & Insoles ?Patient / Caregiver Report:  Patient is satisfied with fit and function of shoes and insoles. ? ?OBJECTIVE DATA: ?Patient History / Diagnosis:   ?  ICD-10-CM   ?1. DM type 2 with diabetic peripheral neuropathy (HCC)  E11.42   ?  ?2. Hammertoes of both feet  M20.41   ? M20.42   ?  ?3. Callus  L84   ?  ? ? ?Change in Status:   None ? ?ACTIONS PERFORMED: ?In-Person Delivery, patient was fit with: ?- 1x pair A5500 PDAC approved prefabricated Diabetic Shoes: Apex A350W ?- 3x pair A5514 PDAC approved [employer] custom diabetic insoles ? ?Shoes and insoles were verified for structural integrity and safety. Patient wore shoes and insoles in office. Skin was inspected and free of areas of concern after wearing shoes and inserts. Shoes and inserts fit properly. Patient / Caregiver provided with ferbal instruction and demonstration regarding donning, doffing, wear, care, proper fit, function, purpose, cleaning, and use of shoes and insoles ' and in all related precautions and risks and benefits regarding shoes and insoles. Patient / Caregiver was instructed to wear properly fitting socks with shoes at all times. Patient was also provided with verbal instruction regarding how to report any failures or malfunctions of shoes or inserts, and necessary follow up care. Patient / Caregiver was also instructed to contact physician regarding change in status that may affect function of shoes and inserts.  ? ?Patient / Caregiver verbalized undersatnding of instruction provided. Patient / Caregiver demonstrated independence with proper donning and doffing of shoes and inserts. ? ?PLAN ?Patient to follow with treating physician as recommended. Plan of care was discussed with and agreed upon by patient and/or caregiver. All questions were answered and concerns addressed. ? ?

## 2021-05-04 NOTE — Progress Notes (Signed)
?  Subjective:  ?Patient ID: Sabrina Thompson, female    DOB: 1955-10-12,  MRN: [id number] ? ?Chief Complaint  ?Patient presents with  ? Routine Post Op  ?  1 week follow up left tenotomy. Pt states she is doing well.  ? ?66 y.o. female presents with the above complaint. History confirmed with patient. Here today for follow-up of left foot tentomies.  ? ?Objective:  ?Physical Exam: warm, good capillary refill, nail exam normal nails without lesions, no trophic changes or ulcerative lesions. DP pulses palpable, PT pulses palpable, and protective sensation absent ?Left Foot: hammertoe deformities noted; dropfoot left. 2nd toe rigid deformity, hallux IPJ exostosis noted. 3rd-5th toes reducible and improved position Well healed incisions. Marland Kitchen ?Right Foot: hammertoe deformities noted 2-4 with flexibility.  ? ?No images are attached to the encounter. ? ?Assessment:  ? ?No diagnosis found. ? ? ?Plan:  ?Patient was evaluated and treated and all questions answered. ? ?Hammertoe, Diabetes, and DPN ? ?Dropfoot Left ?-Unable to wear brace given hallux exostosis. She may benefit from arthroplasty to allow wearing of brace at some point.  ? ?Hammertoes left foot ?-Left foot well healed and toes appear more straight.  ?Will return in one week for procedure for tenotomy on the right s-4 digits.  ? ? ? ? ?No follow-ups on file.  ?

## 2021-05-11 ENCOUNTER — Other Ambulatory Visit: Payer: Self-pay

## 2021-05-11 ENCOUNTER — Encounter: Payer: Self-pay | Admitting: Podiatry

## 2021-05-11 ENCOUNTER — Ambulatory Visit (INDEPENDENT_AMBULATORY_CARE_PROVIDER_SITE_OTHER): Payer: Medicare Other | Admitting: Podiatry

## 2021-05-11 DIAGNOSIS — M2042 Other hammer toe(s) (acquired), left foot: Secondary | ICD-10-CM | POA: Diagnosis not present

## 2021-05-11 DIAGNOSIS — E1142 Type 2 diabetes mellitus with diabetic polyneuropathy: Secondary | ICD-10-CM

## 2021-05-11 DIAGNOSIS — M21372 Foot drop, left foot: Secondary | ICD-10-CM

## 2021-05-11 DIAGNOSIS — M2041 Other hammer toe(s) (acquired), right foot: Secondary | ICD-10-CM

## 2021-05-11 NOTE — Progress Notes (Signed)
?  Subjective:  ?Patient ID: Sabrina Thompson, female    DOB: [birth year],  MRN: [id number] ? ?No chief complaint on file. ? ?66 y.o. female presents for follow-up of hammertoes. She recently underwent tenotomies on the left foot and now interested in tenotomies on the right. Her second through fourth digits have been bothersome and rubbing on the tops of her shoes and would like them straightened. Denies any other pedal complaints.  ? ?Objective:  ?Physical Exam: warm, good capillary refill, nail exam normal nails without lesions, no trophic changes or ulcerative lesions. DP pulses palpable, PT pulses palpable, and protective sensation absent ?Left Foot: hammertoe deformities noted; dropfoot left. 2nd toe rigid deformity, hallux IPJ exostosis noted. 3rd-5th toes reducible. ?Right Foot: hammertoe deformities noted ? ?No images are attached to the encounter. ? ?Assessment:  ? ?1. Hammertoes of both feet   ?2. DM type 2 with diabetic peripheral neuropathy (HCC)   ?3. Left foot drop   ? ? ? ?Plan:  ?Patient was evaluated and treated and all questions answered. ? ?Hammertoe, Diabetes, and DPN ? ?Dropfoot Left ?-Unable to wear brace given hallux exostosis. She may benefit from arthroplasty of first and second to allow wearing of brace at some point.  ? ?Hammertoes left foot ?-Healing well  ? ?Hammertoes on right foot.  ?-Patient electing to go with tenotomy of right foot hammertoes of right second third and fourth digits ? ?Procedure: Flexor Tenotomy ?Indication for Procedure: toe with semi-reducible hammertoe with distal tip pain and callus. Flexor tenotomy indicated to alleviate contracture, reduce pressure, and enhance healing of the ulceration. ?Location: right second third and fourth digits  ?Anesthesia: Lidocaine 1% plain; 57mL digital block ?Instrumentation: 18 gauge needle  ?Technique: The toe was anesthetized as above and prepped in the usual fashion. The toe was exanquinated and a tourniquet was secured at the base  of the toe. A 18 gauge needle was then used to make a transverse incision over the plantar aspect of the distal interphalangeal joint. The flexor tendon was incised with noted release of the hammertoe deformity. The incision was then irrigated and dressed with sterile dressing. Patient tolerated the procedure well. ?Dressing: Dry, sterile, compression dressing. ?Disposition: Patient tolerated procedure well. Patient to return in 1 week for follow-up. ? ? ?Return in about 1 week (around 05/18/2021) for post op.  ?

## 2021-05-17 ENCOUNTER — Ambulatory Visit: Payer: Medicare Other | Admitting: Podiatry

## 2021-05-24 ENCOUNTER — Encounter: Payer: Self-pay | Admitting: Podiatry

## 2021-05-24 ENCOUNTER — Ambulatory Visit (INDEPENDENT_AMBULATORY_CARE_PROVIDER_SITE_OTHER): Payer: Medicare Other | Admitting: Podiatry

## 2021-05-24 DIAGNOSIS — M2041 Other hammer toe(s) (acquired), right foot: Secondary | ICD-10-CM

## 2021-05-24 DIAGNOSIS — M2042 Other hammer toe(s) (acquired), left foot: Secondary | ICD-10-CM

## 2021-05-24 NOTE — Progress Notes (Signed)
?  Subjective:  ?Patient ID: LETTIE MUNN, female    DOB: 04-26-1955,  MRN: [id number] ? ?Chief Complaint  ?Patient presents with  ? Nail Problem  ? ?66 y.o. female presents for follow-up of hammertoes. Doing well.  Denies any other pedal complaints.  ? ?Objective:  ?Physical Exam: warm, good capillary refill, nail exam normal nails without lesions, no trophic changes or ulcerative lesions. DP pulses palpable, PT pulses palpable, and protective sensation absent ?Left Foot: hammertoe deformities noted; dropfoot left. 2nd toe rigid deformity, hallux IPJ exostosis noted. 3rd-5th toes reducible. ?Right Foot: hammertoe deformities noted ? ?No images are attached to the encounter. ? ?Assessment:  ? ?1. Hammertoes of both feet   ? ? ? ?Plan:  ?Patient was evaluated and treated and all questions answered. ? ?Hammertoe, Diabetes, and DPN ? ?Dropfoot Left ?-. She may benefit from arthroplasty of first and second to allow wearing of brace at some point. At this time doing well.  ? ?Hammertoes left foot ?-Healing well  ? ?Hammertoes on right foot.  ?-Healing well  ?Return in 4 weeks for rfc.  ? ? ? ? ?Return in about 4 weeks (around 06/21/2021) for rfc.  ?

## 2021-06-03 DIAGNOSIS — E1165 Type 2 diabetes mellitus with hyperglycemia: Secondary | ICD-10-CM | POA: Diagnosis not present

## 2021-06-03 DIAGNOSIS — I10 Essential (primary) hypertension: Secondary | ICD-10-CM | POA: Diagnosis not present

## 2021-06-09 DIAGNOSIS — L309 Dermatitis, unspecified: Secondary | ICD-10-CM | POA: Insufficient documentation

## 2021-06-09 DIAGNOSIS — E871 Hypo-osmolality and hyponatremia: Secondary | ICD-10-CM | POA: Diagnosis not present

## 2021-06-09 DIAGNOSIS — L678 Other hair color and hair shaft abnormalities: Secondary | ICD-10-CM | POA: Insufficient documentation

## 2021-06-09 DIAGNOSIS — E1165 Type 2 diabetes mellitus with hyperglycemia: Secondary | ICD-10-CM | POA: Diagnosis not present

## 2021-06-09 DIAGNOSIS — I10 Essential (primary) hypertension: Secondary | ICD-10-CM | POA: Diagnosis not present

## 2021-06-09 DIAGNOSIS — L84 Corns and callosities: Secondary | ICD-10-CM | POA: Diagnosis not present

## 2021-06-09 DIAGNOSIS — E782 Mixed hyperlipidemia: Secondary | ICD-10-CM | POA: Diagnosis not present

## 2021-06-09 DIAGNOSIS — N3281 Overactive bladder: Secondary | ICD-10-CM | POA: Diagnosis not present

## 2021-06-09 DIAGNOSIS — G629 Polyneuropathy, unspecified: Secondary | ICD-10-CM | POA: Diagnosis not present

## 2021-06-09 DIAGNOSIS — M21372 Foot drop, left foot: Secondary | ICD-10-CM | POA: Diagnosis not present

## 2021-06-09 DIAGNOSIS — L682 Localized hypertrichosis: Secondary | ICD-10-CM | POA: Diagnosis not present

## 2021-06-10 DIAGNOSIS — R531 Weakness: Secondary | ICD-10-CM | POA: Insufficient documentation

## 2021-06-14 DIAGNOSIS — M6281 Muscle weakness (generalized): Secondary | ICD-10-CM | POA: Diagnosis not present

## 2021-06-23 DIAGNOSIS — E1165 Type 2 diabetes mellitus with hyperglycemia: Secondary | ICD-10-CM | POA: Diagnosis not present

## 2021-06-23 DIAGNOSIS — M7520 Bicipital tendinitis, unspecified shoulder: Secondary | ICD-10-CM | POA: Insufficient documentation

## 2021-06-23 DIAGNOSIS — F32A Depression, unspecified: Secondary | ICD-10-CM | POA: Diagnosis not present

## 2021-06-23 DIAGNOSIS — I10 Essential (primary) hypertension: Secondary | ICD-10-CM | POA: Diagnosis not present

## 2021-06-23 DIAGNOSIS — M7521 Bicipital tendinitis, right shoulder: Secondary | ICD-10-CM | POA: Diagnosis not present

## 2021-06-29 ENCOUNTER — Encounter: Payer: Self-pay | Admitting: Podiatry

## 2021-06-29 ENCOUNTER — Ambulatory Visit (INDEPENDENT_AMBULATORY_CARE_PROVIDER_SITE_OTHER): Payer: Medicare Other | Admitting: Podiatry

## 2021-06-29 DIAGNOSIS — B351 Tinea unguium: Secondary | ICD-10-CM | POA: Diagnosis not present

## 2021-06-29 DIAGNOSIS — E1169 Type 2 diabetes mellitus with other specified complication: Secondary | ICD-10-CM

## 2021-06-29 DIAGNOSIS — E1142 Type 2 diabetes mellitus with diabetic polyneuropathy: Secondary | ICD-10-CM | POA: Diagnosis not present

## 2021-06-29 DIAGNOSIS — L84 Corns and callosities: Secondary | ICD-10-CM

## 2021-06-29 DIAGNOSIS — M21372 Foot drop, left foot: Secondary | ICD-10-CM

## 2021-06-29 NOTE — Progress Notes (Signed)
?  Subjective:  ?Patient ID: Sabrina Thompson, female    DOB: 1955-09-29,  MRN: [id number] ? ?No chief complaint on file. ? ?66 y.o. female presents for follow-up of hammertoes. Doing well.  Here today for toenail trim with concern of thickened elongated and painful nails that are difficult to trim. Requesting to have them trimmed today. Relates burning and tingling in their feet. Patient is diabetic and last A1c was  ?Lab Results  ?Component Value Date  ? HGBA1C 6.8 (H) 06/30/2016  ? .  ? ?PCP:  Reather Converse, PA-C   ?Denies any other pedal complaints.  ? ?Objective:  ?Physical Exam: warm, good capillary refill, nail exam normal nails without lesions, no trophic changes or ulcerative lesions. DP pulses palpable, PT pulses palpable, and protective sensation absent ?Left Foot: hammertoe deformities noted; dropfoot left. 2nd toe rigid deformity, hallux IPJ exostosis noted. 3rd-5th toes reducible. ?Right Foot: hammertoe deformities noted ?Nails 1-5 b/l are thickened and elongated with subungual debris.  ? ?No images are attached to the encounter. ? ?Assessment:  ? ?1. DM type 2 with diabetic peripheral neuropathy (HCC)   ?2. Callus   ?3. 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 Left foot drop   ? ? ? ?Plan:  ?Patient was evaluated and treated and all questions answered. ? ?-Discussed and educated patient on diabetic foot care, especially with  ?regards to the vascular, neurological and musculoskeletal systems.  ?-Stressed the importance of good glycemic control and the detriment of not  ?controlling glucose levels in relation to the foot. ?-Discussed supportive shoes at all times and checking feet regularly.  ?-Mechanically debrided all nails 1-5 bilateral using sterile nail nipper and filed with dremel without incident  ?-Answered all patient questions ?-Patient to return  in 3 months for at risk foot care ?-Patient advised to call the office if any  problems or questions arise in the meantime. ? ? ? ? ? ?Return in about 3 months (around 09/29/2021) for rfc.  ?

## 2021-07-04 DIAGNOSIS — Z1211 Encounter for screening for malignant neoplasm of colon: Secondary | ICD-10-CM | POA: Diagnosis not present

## 2021-07-14 DIAGNOSIS — B372 Candidiasis of skin and nail: Secondary | ICD-10-CM | POA: Insufficient documentation

## 2021-07-14 LAB — EXTERNAL GENERIC LAB PROCEDURE: COLOGUARD: NEGATIVE

## 2021-07-14 LAB — COLOGUARD: COLOGUARD: NEGATIVE

## 2021-07-20 DIAGNOSIS — E119 Type 2 diabetes mellitus without complications: Secondary | ICD-10-CM | POA: Diagnosis not present

## 2021-07-22 ENCOUNTER — Encounter (HOSPITAL_COMMUNITY): Payer: Self-pay

## 2021-07-22 ENCOUNTER — Emergency Department (HOSPITAL_COMMUNITY)
Admission: EM | Admit: 2021-07-22 | Discharge: 2021-07-22 | Disposition: A | Discharge status: Home / Self Care | Payer: Medicare Other | Attending: Emergency Medicine | Admitting: Emergency Medicine

## 2021-07-22 ENCOUNTER — Other Ambulatory Visit: Payer: Self-pay

## 2021-07-22 DIAGNOSIS — Z7984 Long term (current) use of oral hypoglycemic drugs: Secondary | ICD-10-CM | POA: Insufficient documentation

## 2021-07-22 DIAGNOSIS — Z743 Need for continuous supervision: Secondary | ICD-10-CM | POA: Diagnosis not present

## 2021-07-22 DIAGNOSIS — R739 Hyperglycemia, unspecified: Secondary | ICD-10-CM | POA: Diagnosis not present

## 2021-07-22 DIAGNOSIS — Z794 Long term (current) use of insulin: Secondary | ICD-10-CM | POA: Diagnosis not present

## 2021-07-22 DIAGNOSIS — E1165 Type 2 diabetes mellitus with hyperglycemia: Secondary | ICD-10-CM | POA: Diagnosis not present

## 2021-07-22 LAB — CBC WITH DIFFERENTIAL/PLATELET
Abs Immature Granulocytes: 0.01 10*3/uL (ref 0.00–0.07)
Basophils Absolute: 0.1 10*3/uL (ref 0.0–0.1)
Basophils Relative: 1 %
Eosinophils Absolute: 0.1 10*3/uL (ref 0.0–0.5)
Eosinophils Relative: 1 %
HCT: 32.3 % — ABNORMAL LOW (ref 36.0–46.0)
Hemoglobin: 11.1 g/dL — ABNORMAL LOW (ref 12.0–15.0)
Immature Granulocytes: 0 %
Lymphocytes Relative: 39 %
Lymphs Abs: 2.3 10*3/uL (ref 0.7–4.0)
MCH: 30.5 pg (ref 26.0–34.0)
MCHC: 34.4 g/dL (ref 30.0–36.0)
MCV: 88.7 fL (ref 80.0–100.0)
Monocytes Absolute: 0.6 10*3/uL (ref 0.1–1.0)
Monocytes Relative: 9 %
Neutro Abs: 3 10*3/uL (ref 1.7–7.7)
Neutrophils Relative %: 50 %
Platelets: 254 10*3/uL (ref 150–400)
RBC: 3.64 MIL/uL — ABNORMAL LOW (ref 3.87–5.11)
RDW: 12 % (ref 11.5–15.5)
WBC: 5.9 10*3/uL (ref 4.0–10.5)
nRBC: 0 % (ref 0.0–0.2)

## 2021-07-22 LAB — URINALYSIS, ROUTINE W REFLEX MICROSCOPIC
Bacteria, UA: NONE SEEN
Bilirubin Urine: NEGATIVE
Glucose, UA: >=500 mg/dL — AB
Hgb urine dipstick: NEGATIVE
Ketones, ur: NEGATIVE mg/dL
Leukocytes,Ua: NEGATIVE
Nitrite: NEGATIVE
Protein, ur: NEGATIVE mg/dL
Specific Gravity, Urine: 1.006 (ref 1.005–1.030)
pH: 6 (ref 5.0–8.0)

## 2021-07-22 LAB — CBG MONITORING, ED
Glucose-Capillary: 365 mg/dL — ABNORMAL HIGH (ref 70–99)
Glucose-Capillary: 181 mg/dL — ABNORMAL HIGH (ref 70–99)

## 2021-07-22 LAB — BASIC METABOLIC PANEL
Anion gap: 7 (ref 5–15)
BUN: 16 mg/dL (ref 8–23)
CO2: 29 mmol/L (ref 22–32)
Calcium: 8.9 mg/dL (ref 8.9–10.3)
Chloride: 88 mmol/L — ABNORMAL LOW (ref 98–111)
Creatinine, Ser: 0.84 mg/dL (ref 0.44–1.00)
GFR, Estimated: >60 mL/min (ref >60)
Glucose, Bld: 325 mg/dL — ABNORMAL HIGH (ref 70–99)
Potassium: 3.4 mmol/L — ABNORMAL LOW (ref 3.5–5.1)
Sodium: 124 mmol/L — ABNORMAL LOW (ref 135–145)

## 2021-07-22 MED ORDER — INSULIN ASPART 100 UNIT/ML IJ SOLN
12.0000 [IU] | Freq: Once | INTRAMUSCULAR | Status: AC
  Administered 2021-07-22: 12 [IU] via SUBCUTANEOUS
  Filled 2021-07-22: qty 1

## 2021-07-22 NOTE — Discharge Instructions (Signed)
Call your primary care doctor or specialist as discussed in the next 2-3 days.   Return immediately back to the ER if:  Your symptoms worsen within the next 12-24 hours. You develop new symptoms such as new fevers, persistent vomiting, new pain, shortness of breath, or new weakness or numbness, or if you have any other concerns.  

## 2021-07-22 NOTE — ED Triage Notes (Signed)
Pt reports her blood sugar has been around 200-300 for the past 2 weeks. Reports she ate 2 poptarts this morning and her cbg was 425.  EMS administered approx normal saline and cbg on arrival was 345.  EDP at bedside

## 2021-07-22 NOTE — ED Provider Notes (Signed)
[facility] EMERGENCY DEPARTMENT Provider Note   CSN: [id number] Arrival date & time: 07/22/21  1536     History  Chief Complaint  Patient presents with   Hyperglycemia    Sabrina Thompson is a 66 y.o. female.  Presents to ER chief complaint of high blood sugars.  She states they have been high for the past 2 weeks around 200 300 range.  She states that she had a pop tart today and it shot up into the 400s and she called her primary care doctor who advised her to go to the ER.  Otherwise denies any other symptoms no fever no cough no vomiting no diarrhea no headache no chest pain no abdominal pain.  She was recently started on slow acting insulin 1 month ago.  Otherwise does not use a sliding scale.       Home Medications Prior to Admission medications   Medication Sig Start Date End Date Taking? Authorizing Provider  atorvastatin (LIPITOR) 10 MG tablet Take 10 mg by mouth daily. 01/19/21  Yes [provider]  celecoxib (CELEBREX) 100 MG capsule Take 100 mg by mouth 2 (two) times daily. 08/18/20  Yes [provider]  chlorthalidone (HYGROTON) 25 MG tablet Take 25 mg by mouth daily. 08/18/20  Yes [provider]  cyclobenzaprine (FLEXERIL) 5 MG tablet Take 5 mg by mouth at bedtime.   Yes [provider]  FLUoxetine (PROZAC) 20 MG capsule Take 20 mg by mouth daily.   Yes [provider]  gabapentin (NEURONTIN) 300 MG capsule Take 300 mg by mouth daily as needed. 08/18/20  Yes [provider]  ibuprofen (ADVIL,MOTRIN) 200 MG tablet Take 800 mg by mouth every 6 (six) hours as needed. Pain   Yes [provider]  insulin glargine, 2 Unit Dial, (TOUJEO MAX SOLOSTAR) 300 UNIT/ML Solostar Pen Inject 20-24 Units into the skin at bedtime.   Yes [provider]  Multiple Vitamins-Minerals (ONE-A-DAY WOMENS 50+ ADVANTAGE PO) Take 1 tablet by mouth daily.   Yes [provider]  olmesartan (BENICAR) 20 MG tablet Take 20 mg by  mouth daily. 07/14/21  Yes [provider]  oxybutynin (DITROPAN) 5 MG tablet Take 5 mg by mouth daily. 08/18/20  Yes [provider]  traZODone (DESYREL) 100 MG tablet Take 50 mg by mouth at bedtime.   Yes [provider]  glipiZIDE (GLUCOTROL) 5 MG tablet Take 1 tablet (5 mg total) by mouth 2 (two) times daily. 06/30/16   Eustace Moore, MD  olmesartan-hydrochlorothiazide (BENICAR HCT) 20-12.5 MG tablet Take 1 tablet by mouth daily. 03/21/21   [provider]  RYBELSUS 7 MG TABS Take 1 tablet by mouth daily. 03/21/21   [provider]      Allergies    Prednisone and Prednisone    Review of Systems   Review of Systems  Constitutional:  Negative for fever.  HENT:  Negative for ear pain.   Eyes:  Negative for pain.  Respiratory:  Negative for cough.   Cardiovascular:  Negative for chest pain.  Gastrointestinal:  Negative for abdominal pain.  Genitourinary:  Negative for flank pain.  Musculoskeletal:  Negative for back pain.  Skin:  Negative for rash.  Neurological:  Negative for headaches.    Physical Exam Updated Vital Signs BP (!) 152/72   Pulse 66   Temp 98.4 F (36.9 C) (Oral)   Resp 19   Ht 5\' 5"  (1.651 m)   Wt 84.4 kg   LMP  02/13/2005 (Approximate)   SpO2 100%   BMI 30.95 kg/m  Physical Exam Constitutional:      General: She is not in acute distress.    Appearance: Normal appearance.  HENT:     Head: Normocephalic.     Nose: Nose normal.  Eyes:     Extraocular Movements: Extraocular movements intact.  Cardiovascular:     Rate and Rhythm: Normal rate.  Pulmonary:     Effort: Pulmonary effort is normal.  Musculoskeletal:        General: Normal range of motion.     Cervical back: Normal range of motion.  Neurological:     General: No focal deficit present.     Mental Status: She is alert. Mental status is at baseline.     ED Results / Procedures / Treatments   Labs (all labs ordered are listed, but only abnormal  results are displayed) Labs Reviewed  CBC WITH DIFFERENTIAL/PLATELET - Abnormal; Notable for the following components:      Result Value   RBC 3.64 (*)    Hemoglobin 11.1 (*)    HCT 32.3 (*)    All other components within normal limits  BASIC METABOLIC PANEL - Abnormal; Notable for the following components:   Sodium 124 (*)    Potassium 3.4 (*)    Chloride 88 (*)    Glucose, Bld 325 (*)    All other components within normal limits  CBG MONITORING, ED - Abnormal; Notable for the following components:   Glucose-Capillary 365 (*)    All other components within normal limits  CBG MONITORING, ED - Abnormal; Notable for the following components:   Glucose-Capillary 181 (*)    All other components within normal limits  URINALYSIS, ROUTINE W REFLEX MICROSCOPIC    EKG None  Radiology No results found.  Procedures Procedures    Medications Ordered in ED Medications  insulin aspart (novoLOG) injection 12 Units (12 Units Subcutaneous Given 07/22/21 1614)    ED Course/ Medical Decision Making/ A&P                           Medical Decision Making Amount and/or Complexity of Data Reviewed Labs: ordered.  Risk Prescription drug management.   Chart review shows office visit May 72,023 for diabetes.  Cardiac monitoring showing sinus rhythm.  Diagnostic studies included labs glucose elevated greater than 300.  Anion gap normal bicarb normal.  Given insulin subcu 12 units with improvement of blood sugars.  Patient remains asymptomatic.  Advised her to call and follow-up with her primary care doctor.  She may need additional insulin therapy.  Advised immediate return if her blood sugars continue to run high, she has fevers pain vomiting or any additional concerns return back to the ER.        Final Clinical Impression(s) / ED Diagnoses Final diagnoses:  Hyperglycemia    Rx / DC Orders ED Discharge Orders     None         Cheryll Cockayne, MD 07/22/21 [phone]

## 2021-07-22 NOTE — ED Notes (Signed)
Not in waiting area x 1 

## 2021-07-27 DIAGNOSIS — E1165 Type 2 diabetes mellitus with hyperglycemia: Secondary | ICD-10-CM | POA: Diagnosis not present

## 2021-08-08 DIAGNOSIS — E119 Type 2 diabetes mellitus without complications: Secondary | ICD-10-CM | POA: Diagnosis not present

## 2021-08-17 DIAGNOSIS — E1165 Type 2 diabetes mellitus with hyperglycemia: Secondary | ICD-10-CM | POA: Diagnosis not present

## 2021-08-23 ENCOUNTER — Other Ambulatory Visit (HOSPITAL_COMMUNITY): Payer: Self-pay | Admitting: Internal Medicine

## 2021-08-23 DIAGNOSIS — Z1231 Encounter for screening mammogram for malignant neoplasm of breast: Secondary | ICD-10-CM

## 2021-10-04 ENCOUNTER — Ambulatory Visit: Payer: Medicare Other | Admitting: Podiatry

## 2021-10-10 ENCOUNTER — Ambulatory Visit (HOSPITAL_COMMUNITY)
Admission: RE | Admit: 2021-10-10 | Discharge: 2021-10-10 | Disposition: A | Discharge status: Home / Self Care | Payer: Medicare Other | Source: Ambulatory Visit | Attending: Internal Medicine | Admitting: Internal Medicine

## 2021-10-10 DIAGNOSIS — Z1231 Encounter for screening mammogram for malignant neoplasm of breast: Secondary | ICD-10-CM | POA: Insufficient documentation

## 2021-10-11 ENCOUNTER — Telehealth: Payer: Medicare Other

## 2021-10-18 DIAGNOSIS — E1165 Type 2 diabetes mellitus with hyperglycemia: Secondary | ICD-10-CM | POA: Diagnosis not present

## 2021-10-18 DIAGNOSIS — E782 Mixed hyperlipidemia: Secondary | ICD-10-CM | POA: Diagnosis not present

## 2021-10-18 DIAGNOSIS — I10 Essential (primary) hypertension: Secondary | ICD-10-CM | POA: Diagnosis not present

## 2021-10-19 ENCOUNTER — Encounter: Payer: Self-pay | Admitting: Podiatry

## 2021-10-19 ENCOUNTER — Ambulatory Visit (INDEPENDENT_AMBULATORY_CARE_PROVIDER_SITE_OTHER): Payer: Medicare Other | Admitting: Podiatry

## 2021-10-19 DIAGNOSIS — M79675 Pain in left toe(s): Secondary | ICD-10-CM

## 2021-10-19 DIAGNOSIS — E1142 Type 2 diabetes mellitus with diabetic polyneuropathy: Secondary | ICD-10-CM

## 2021-10-19 DIAGNOSIS — M79674 Pain in right toe(s): Secondary | ICD-10-CM

## 2021-10-19 DIAGNOSIS — B351 Tinea unguium: Secondary | ICD-10-CM | POA: Diagnosis not present

## 2021-10-19 DIAGNOSIS — L84 Corns and callosities: Secondary | ICD-10-CM

## 2021-10-19 NOTE — Progress Notes (Signed)
  Subjective:  Patient ID: Sabrina Thompson, female    DOB: 1955/11/21,  MRN: [id number]  Chief Complaint  Patient presents with   Foot Problem    ROUTINE FOOT CARE- NAIL TRIM REQUESTED, CALLOUS ON BOTTOM OF LEFT FOOT    66 y.o. female presents oday for toenail trim with concern of thickened elongated and painful nails that are difficult to trim. Requesting to have them trimmed today. Relates burning and tingling in their feet. Patient is diabetic and last A1c was  Lab Results  Component Value Date   HGBA1C 6.8 (H) 06/30/2016   .   PCP:  Leone Payor, FNP   Denies any other pedal complaints.   Objective:  Physical Exam: warm, good capillary refill, nail exam normal nails without lesions, no trophic changes or ulcerative lesions. DP pulses palpable, PT pulses palpable, and protective sensation absent Left Foot: hammertoe deformities noted; dropfoot left. 2nd toe rigid deformity, hallux IPJ exostosis noted. 3rd-5th toes reducible. Right Foot: hammertoe deformities noted Nails 1-5 b/l are thickened and elongated with subungual debris. Hyperkeratotic lesion noted sub first metatarsal on left.   No images are attached to the encounter.  Assessment:   1. Pain due to onychomycosis of toenails of both feet   2. Callus   3. DM type 2 with diabetic peripheral neuropathy (HCC)      Plan:  Patient was evaluated and treated and all questions answered.  -Discussed and educated patient on diabetic foot care, especially with  regards to the vascular, neurological and musculoskeletal systems.  -Stressed the importance of good glycemic control and the detriment of not  controlling glucose levels in relation to the foot. -Discussed supportive shoes at all times and checking feet regularly.  -Mechanically debrided all nails 1-5 bilateral using sterile nail nipper and filed with dremel without incident  -Hyperkeratotic tissue debrided without incident with chisel.  -Answered all patient  questions -Patient to return  in 3 months for at risk foot care -Patient advised to call the office if any problems or questions arise in the meantime.      No follow-ups on file.

## 2021-10-20 ENCOUNTER — Encounter (HOSPITAL_COMMUNITY): Payer: Self-pay

## 2021-10-20 ENCOUNTER — Ambulatory Visit (HOSPITAL_COMMUNITY): Payer: Medicare Other

## 2021-10-25 DIAGNOSIS — L84 Corns and callosities: Secondary | ICD-10-CM | POA: Diagnosis not present

## 2021-10-25 DIAGNOSIS — E871 Hypo-osmolality and hyponatremia: Secondary | ICD-10-CM | POA: Diagnosis not present

## 2021-10-25 DIAGNOSIS — E782 Mixed hyperlipidemia: Secondary | ICD-10-CM | POA: Diagnosis not present

## 2021-10-25 DIAGNOSIS — L682 Localized hypertrichosis: Secondary | ICD-10-CM | POA: Diagnosis not present

## 2021-10-25 DIAGNOSIS — I10 Essential (primary) hypertension: Secondary | ICD-10-CM | POA: Diagnosis not present

## 2021-10-25 DIAGNOSIS — Z0001 Encounter for general adult medical examination with abnormal findings: Secondary | ICD-10-CM | POA: Diagnosis not present

## 2021-10-25 DIAGNOSIS — M21372 Foot drop, left foot: Secondary | ICD-10-CM | POA: Diagnosis not present

## 2021-10-25 DIAGNOSIS — N3281 Overactive bladder: Secondary | ICD-10-CM | POA: Diagnosis not present

## 2021-10-25 DIAGNOSIS — E1165 Type 2 diabetes mellitus with hyperglycemia: Secondary | ICD-10-CM | POA: Diagnosis not present

## 2021-10-25 DIAGNOSIS — G629 Polyneuropathy, unspecified: Secondary | ICD-10-CM | POA: Diagnosis not present

## 2021-11-24 DIAGNOSIS — M25511 Pain in right shoulder: Secondary | ICD-10-CM | POA: Diagnosis not present

## 2021-11-24 DIAGNOSIS — E1165 Type 2 diabetes mellitus with hyperglycemia: Secondary | ICD-10-CM | POA: Diagnosis not present

## 2021-11-24 DIAGNOSIS — Z23 Encounter for immunization: Secondary | ICD-10-CM | POA: Diagnosis not present

## 2021-12-06 ENCOUNTER — Encounter: Payer: Self-pay | Admitting: Orthopedic Surgery

## 2021-12-06 ENCOUNTER — Ambulatory Visit (INDEPENDENT_AMBULATORY_CARE_PROVIDER_SITE_OTHER): Payer: Medicare Other

## 2021-12-06 ENCOUNTER — Ambulatory Visit (INDEPENDENT_AMBULATORY_CARE_PROVIDER_SITE_OTHER): Payer: Medicare Other | Admitting: Orthopedic Surgery

## 2021-12-06 VITALS — BP 154/86 | HR 73 | Ht 65.0 in | Wt 184.0 lb

## 2021-12-06 DIAGNOSIS — M25511 Pain in right shoulder: Secondary | ICD-10-CM

## 2021-12-06 DIAGNOSIS — G8929 Other chronic pain: Secondary | ICD-10-CM

## 2021-12-06 NOTE — Patient Instructions (Signed)

## 2021-12-06 NOTE — Progress Notes (Signed)
New Patient Visit  Assessment: Sabrina Thompson is a 66 y.o. female with the following: 1. Chronic right shoulder pain  Plan: Marissah S Mceachern has chronic pain in her right shoulder.  Recent worsening.  Radiographs without obvious injury.  No evidence of proximal humeral migration.  I offered her a steroid injection, and this was completed in clinic today.  No issues with the injection.  We will also place a referral for physical therapy.  If she does not have sustained relief, we can consider obtaining an MRI.  Follow-up as needed.   Procedure note injection - Right shoulder    Verbal consent was obtained to inject the right shoulder, subacromial space Timeout was completed to confirm the site of injection.   The skin was prepped with alcohol and ethyl chloride was sprayed at the injection site.  A 21-gauge needle was used to inject 40 mg of Depo-Medrol and 1% lidocaine (3 cc) into the subacromial space of the right shoulder using a posterolateral approach.  There were no complications.  A sterile bandage was applied.    Follow-up: Return if symptoms worsen or fail to improve.  Subjective:  Chief Complaint  Patient presents with   Shoulder Pain    Pt with Rt shoulder pain, states pain started approx 11 yrs ago. She remembers going up to brush her hair felt a pop, followed by a burning pain . Currently having pain in shoulder and weakness in the hand for 2-3 years.     History of Present Illness: Sabrina Thompson is a 66 y.o. female who has been referred by Cecile Sheerer, NP for evaluation of right shoulder pain.  She states she has had pain intermittently in the right shoulder for at least 10 years.  She remembers lifting her arm to brush her hair, and felt a pop.  This was associated with sharp pain and burning sensations.  Since then, she has had pain at times in the right shoulder.  Currently, she is having difficulty with overhead motion.  She also notes some weakness in her right  hand.  Pain radiates down her arm.  She notes a popping sensation in the anterior shoulder.  She has not worked with physical therapy.  No prior injections.   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    Past Surgical History:  Procedure Laterality Date   BACK SURGERY     lumbar surgery   BACK SURGERY  1998   CHOLECYSTECTOMY  2005   [facility]_Dr Bridger     umbilical   HERNIA REPAIR  [id number]   umbilical    INCISIONAL HERNIA REPAIR  09/22/2011   Procedure: HERNIA REPAIR INCISIONAL;  Surgeon: Jamesetta So, MD;  Location: [facility];  Service: General;  Laterality: N/A;   [place]  disc injury    Family History  Problem Relation Age of Onset   Cancer Other    Diabetes Other    Arthritis Mother    Depression Mother    Diabetes Mother    Heart disease Mother    Miscarriages / [place] Mother    Cancer Father        prostate   Alcohol abuse Father    Arthritis Father    Diabetes Father    Cancer Brother        lung   Drug abuse Daughter        asddicted to pain pills   Social History   Tobacco Use   Smoking status: Former    Years: 3.00    Types: Cigarettes   Smokeless tobacco: Never  Vaping Use   Vaping Use: Never used  Substance Use Topics   Alcohol use: Not Currently   Drug use: Never    Allergies  Allergen Reactions   Prednisone Hives    Also causes confusion    Prednisone Other (See Comments)    Disoriented and hives     No outpatient medications have been marked as taking for the 12/06/21 encounter (Office Visit) with Oliver Barre, MD.    Objective: BP (!) 154/86   Pulse 73   Ht 5\' 5"  (1.651 m)   Wt 184 lb (83.5 kg)   LMP 02/13/2005 (Approximate)   BMI 30.62 kg/m   Physical Exam:  General: Alert and oriented. and No acute distress. Gait: Normal gait.  Right shoulder without deformity.  No atrophy is appreciated.  Forward flexion is limited to 90 degrees.  90 degrees of abduction at her side.  Passively I can get her to 140 degrees before it is too painful.  Positive drop arm test.  Fingers are warm and well-perfused.  2+ radial pulse.   IMAGING: I personally ordered and reviewed the following images  X-ray of the right shoulder was obtained in clinic today.  No acute injuries are noted.  Maintained glenohumeral joint space.  No evidence of proximal humeral migration.  Mild degenerative changes noted at the [facility] joint.  Impression: Right shoulder x-rays with mild degenerative changes.   New Medications:  No orders of the defined types were placed in this encounter.     [facility], MD  12/06/2021 10:27 PM

## 2021-12-07 ENCOUNTER — Ambulatory Visit (HOSPITAL_COMMUNITY)
Admission: RE | Admit: 2021-12-07 | Discharge: 2021-12-07 | Disposition: A | Discharge status: Home / Self Care | Payer: Medicare Other | Source: Ambulatory Visit | Attending: Internal Medicine | Admitting: Internal Medicine

## 2021-12-07 DIAGNOSIS — Z1231 Encounter for screening mammogram for malignant neoplasm of breast: Secondary | ICD-10-CM | POA: Diagnosis not present

## 2021-12-21 ENCOUNTER — Ambulatory Visit (HOSPITAL_COMMUNITY): Payer: Medicare Other | Attending: Orthopedic Surgery | Admitting: Occupational Therapy

## 2021-12-21 DIAGNOSIS — R29898 Other symptoms and signs involving the musculoskeletal system: Secondary | ICD-10-CM | POA: Diagnosis not present

## 2021-12-21 DIAGNOSIS — M25511 Pain in right shoulder: Secondary | ICD-10-CM | POA: Diagnosis not present

## 2021-12-21 DIAGNOSIS — M25611 Stiffness of right shoulder, not elsewhere classified: Secondary | ICD-10-CM | POA: Diagnosis not present

## 2021-12-21 NOTE — Therapy (Unsigned)
OUTPATIENT OCCUPATIONAL THERAPY ORTHO EVALUATION  Patient Name: Sabrina Thompson MRN: [id number] DOB:1955/08/20, 66 y.o., female Today's Date: 12/21/2021  PCP: Lupita Raider, NP REFERRING PROVIDER: Oliver Barre, MD   OT End of Session - 12/21/21 1447     Visit Number 1    Number of Visits 13    Date for OT Re-Evaluation 02/03/22    Authorization Type UHC Medicare, No Co-pay    Authorization Time Period No visit limit    Progress Note Due on Visit 10    OT Start Time 1348    OT Stop Time 1430    OT Time Calculation (min) 42 min    Activity Tolerance Patient tolerated treatment well    Behavior During Therapy [facility] for tasks assessed/performed             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   Depression    Diabetes mellitus    Diabetes mellitus without complication (HCC)    Hypercholesterolemia 11/22/2017   Hyperlipidemia    Hypertension    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   Past Surgical History:  Procedure Laterality Date   BACK SURGERY     lumbar surgery   BACK SURGERY  1998   CHOLECYSTECTOMY  2005   [facility]_Dr Lovell Sheehan   CHOLECYSTECTOMY  1987   DILATION AND CURETTAGE OF UTERUS  1981   dnc  1981   HERNIA REPAIR     umbilical   HERNIA REPAIR  2003   umbilical    INCISIONAL HERNIA REPAIR  09/22/2011   Procedure: HERNIA REPAIR INCISIONAL;  Surgeon: Dalia Heading, MD;  Location: [facility];  Service: General;  Laterality: N/A;   SPINE SURGERY  1998   disc injury   Patient Active Problem List   Diagnosis Date Noted   Callus 12/03/2020   Financial difficulties 02/08/2017   Abnormal Pap smear of cervix 06/30/2016   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  Rationale for Evaluation and Treatment: Rehabilitation  SUBJECTIVE:   SUBJECTIVE STATEMENT: "I am just miserable" Pt accompanied by: self  PERTINENT HISTORY: Pt received steroid injection on 12/06/21, which she reports has helped with the pain.   PRECAUTIONS: None  WEIGHT BEARING RESTRICTIONS: No  PAIN:  Are you having pain? Yes: NPRS scale: 2/10 Pain location: Biceps  Pain description: sharp continual pain Aggravating factors: movement  Relieving factors: rest/no movement, tylenol   FALLS: Has patient fallen in last 6 months? No  LIVING ENVIRONMENT: Lives with: lives alone Lives in: Other Independent Living Facility  PLOF: Independent  PATIENT GOALS: To be able to use my R arm again  OBJECTIVE:   HAND DOMINANCE: Right  ADLs: Overall ADLs: difficulty with dressing and bathing due to RUE ROM limitations. Unable to use the R hand to assist with cooking and cleaning, has a maid that does the heavier cleaning.  FUNCTIONAL OUTCOME MEASURES: FOTO: 55.64  UPPER EXTREMITY ROM:  Active ROM Right eval  Shoulder flexion 63  Shoulder abduction 76  Shoulder internal rotation 90  Shoulder external rotation 25  (Blank rows = not tested)  UPPER EXTREMITY MMT:     MMT Right eval  Shoulder flexion 4-/5  Shoulder abduction 4/5  Shoulder adduction 5/5  Shoulder extension 4+/5  Shoulder internal rotation 4+/5  Shoulder external rotation 3/5  Elbow flexion 5/5  Elbow extension 5/5  (Blank rows = not tested)  HAND FUNCTION: Grip strength: Right: - lbs; Left: - lbs  EDEMA: Swelling noted around the lower bicep region.  OBSERVATIONS: moderate restriction noticed in the trapezius, anterior shoulder girdle, and biceps.    TODAY'S TREATMENT:                                                                                                                               DATE: 12/21/21    PATIENT EDUCATION: Education details: *** Person educated: {Person educated:25204} Education method: {Education Method:25205} Education comprehension: {Education Comprehension:25206}  HOME EXERCISE PROGRAM: ***  GOALS: Goals reviewed with patient? Yes  SHORT TERM GOALS: Target date: {follow up:25551}    *** Baseline: Goal status: {GOALSTATUS:25110}  2.  *** Baseline:  Goal status: {GOALSTATUS:25110}  3.  *** Baseline:  Goal status: {GOALSTATUS:25110}  4.  *** Baseline:  Goal status: {GOALSTATUS:25110}  5.  *** Baseline:  Goal status: {GOALSTATUS:25110}  6.  *** Baseline:  Goal status: {GOALSTATUS:25110}  LONG TERM GOALS: Target date: {follow up:25551}    *** Baseline:  Goal status: {GOALSTATUS:25110}  2.  *** Baseline:  Goal status: {GOALSTATUS:25110}  3.  *** Baseline:  Goal status: {GOALSTATUS:25110}  4.  *** Baseline:  Goal status: {GOALSTATUS:25110}  5.  *** Baseline:  Goal status: {GOALSTATUS:25110}  6.  *** Baseline:  Goal status: {GOALSTATUS:25110}  ASSESSMENT:  CLINICAL IMPRESSION: Patient is a *** y.o. *** who was seen today for occupational therapy evaluation for ***.   PERFORMANCE DEFICITS: in functional skills including {OT physical skills:25468}, cognitive skills including {OT cognitive skills:25469}, and psychosocial skills including {OT psychosocial skills:25470}.   IMPAIRMENTS: are limiting patient from {OT performance deficits:25471}.   COMORBIDITIES: {Comorbidities:25485} that affects occupational performance. Patient will benefit from skilled OT to address above impairments and improve overall function.  MODIFICATION OR ASSISTANCE TO COMPLETE EVALUATION: {OT modification:25474}  OT OCCUPATIONAL PROFILE AND HISTORY: {OT PROFILE AND HISTORY:25484}  CLINICAL DECISION MAKING: {OT  CDM:25475}  REHAB POTENTIAL: {rehabpotential:25112}  EVALUATION COMPLEXITY: {Evaluation complexity:25115}      PLAN:  OT FREQUENCY: {rehab frequency:25116}  OT DURATION: {rehab duration:25117}  PLANNED INTERVENTIONS: {OT Interventions:25467}  RECOMMENDED OTHER SERVICES: ***  CONSULTED AND AGREED WITH PLAN OF CARE: {JQB:34193}  PLAN FOR NEXT SESSION: ***   Maninder Deboer Rosemarie Beath, OT 12/21/2021, 2:49 PM

## 2021-12-22 ENCOUNTER — Encounter (HOSPITAL_COMMUNITY): Payer: Self-pay | Admitting: Occupational Therapy

## 2021-12-26 DIAGNOSIS — Z23 Encounter for immunization: Secondary | ICD-10-CM | POA: Diagnosis not present

## 2021-12-28 ENCOUNTER — Encounter (HOSPITAL_COMMUNITY): Payer: Medicare Other | Admitting: Occupational Therapy

## 2021-12-30 ENCOUNTER — Encounter (HOSPITAL_COMMUNITY): Payer: Self-pay | Admitting: Occupational Therapy

## 2021-12-30 ENCOUNTER — Ambulatory Visit (HOSPITAL_COMMUNITY): Payer: Medicare Other | Admitting: Occupational Therapy

## 2021-12-30 DIAGNOSIS — R29898 Other symptoms and signs involving the musculoskeletal system: Secondary | ICD-10-CM | POA: Diagnosis not present

## 2021-12-30 DIAGNOSIS — M25511 Pain in right shoulder: Secondary | ICD-10-CM

## 2021-12-30 DIAGNOSIS — M25611 Stiffness of right shoulder, not elsewhere classified: Secondary | ICD-10-CM

## 2021-12-30 NOTE — Therapy (Signed)
OUTPATIENT OCCUPATIONAL THERAPY ORTHO TREATMENT  Patient Name: Sabrina Thompson MRN: [id number] DOB:Dec 17, 1955, 66 y.o., female Today's Date: 12/30/2021  PCP: Lupita Raider, NP REFERRING PROVIDER: Oliver Barre, MD   OT End of Session - 12/30/21 1106     Visit Number 2    Number of Visits 13    Date for OT Re-Evaluation 02/03/22    Authorization Type UHC Medicare, No Co-pay    Authorization Time Period No visit limit    Progress Note Due on Visit 10    OT Start Time 1028    OT Stop Time 1110    OT Time Calculation (min) 42 min    Activity Tolerance Patient tolerated treatment well    Behavior During Therapy [facility] for tasks assessed/performed              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   Depression    Diabetes mellitus    Diabetes mellitus without complication (HCC)    Hypercholesterolemia 11/22/2017   Hyperlipidemia    Hypertension    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   Past Surgical History:  Procedure Laterality Date   BACK SURGERY     lumbar surgery   BACK SURGERY  1998   CHOLECYSTECTOMY  2005   [facility]_Dr Lovell Sheehan   CHOLECYSTECTOMY  1987   DILATION AND CURETTAGE OF UTERUS  1981   dnc  1981   HERNIA REPAIR     umbilical   HERNIA REPAIR  2003   umbilical    INCISIONAL HERNIA REPAIR  09/22/2011   Procedure: HERNIA REPAIR INCISIONAL;  Surgeon: Dalia Heading, MD;  Location: [facility];  Service: General;  Laterality: N/A;   SPINE SURGERY  1998   disc injury   Patient Active Problem List   Diagnosis Date Noted   Callus 12/03/2020   Financial difficulties 02/08/2017   Abnormal Pap smear of cervix 06/30/2016   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   SUI (stress urinary incontinence, female) 03/31/2016   Pain due to onychomycosis of toenails of both feet 03/31/2016   Type 2 diabetes mellitus (HCC) 02/16/2016    Benign essential HTN 02/16/2016   HLD (hyperlipidemia) 02/16/2016   Depression (emotion) 02/16/2016    ONSET DATE: 4+ years ago with progressing severity  REFERRING DIAG: R shoulder Pain  THERAPY DIAG:  Acute pain of right shoulder  Stiffness of right shoulder, not elsewhere classified  Other symptoms and signs involving the musculoskeletal system  Rationale for Evaluation and Treatment: Rehabilitation  SUBJECTIVE:   SUBJECTIVE STATEMENT: "I couldn't come last time because I couldn't move my arm " Pt accompanied by: self  PERTINENT HISTORY: Pt received steroid injection on 12/06/21, which she reports has helped with the pain.   PRECAUTIONS: None  WEIGHT BEARING RESTRICTIONS: No  PAIN:  Are you having pain? Without movement, no With movement yes Yes: NPRS scale: 8/10 Pain location: Biceps  Pain description: sharp continual pain Aggravating factors: movement  Relieving factors: rest/no movement, tylenol   FALLS: Has patient fallen in last 6 months? No  LIVING ENVIRONMENT: Lives with: lives alone Lives in: Other Independent Living Facility  PLOF: Independent  PATIENT GOALS: To be able to use my R arm again  OBJECTIVE:   HAND DOMINANCE: Right  ADLs: Overall ADLs: difficulty with dressing and bathing due to RUE ROM  limitations. Unable to use the R hand to assist with cooking and cleaning, has a maid that does the heavier cleaning.  FUNCTIONAL OUTCOME MEASURES: FOTO: 55.64  UPPER EXTREMITY ROM:     Active ROM Right eval  Shoulder flexion 63  Shoulder abduction 76  Shoulder internal rotation 90  Shoulder external rotation 25  (Blank rows = not tested)  UPPER EXTREMITY MMT:     MMT Right eval  Shoulder flexion 4-/5  Shoulder abduction 4/5  Shoulder adduction 4+/5  Shoulder extension 4+/5  Shoulder internal rotation 4+/5  Shoulder external rotation 3/5  Elbow flexion 5/5  Elbow extension 5/5  (Blank rows = not tested)  HAND FUNCTION: Grip  strength: Right: - lbs; Left: - lbs  EDEMA: Swelling noted around the lower bicep region.  OBSERVATIONS: moderate restriction noticed in the trapezius, anterior shoulder girdle, and biceps.    TODAY'S TREATMENT:                                                                                                                              DATE:   12/30/2021  Manual: to address fascial restrictions and improve ROM manual therapy provided to lower/upper bicep, posterior trapezius and anterior shoulder  P/ROM: Supine: Shoulder flexion, abduction, horizontal abduction IR/ER 10x each way AA/ROM: Supine, Shoulder flexion, abduction 10x each way  AROM: seated on EOB shoulder flexion, abduction, IR/ER 10x each way  Isometrics: facing wall with arm in 90 degree angle with fist on wash cloth pushing into wall 10 seconds 3 sets each way (ER/IR/flexion)    12/21/21: Evaluation only   PATIENT EDUCATION: Education details: ROM for shoulder  Person educated: Patient Education method: Explanation Education comprehension: verbalized understanding  HOME EXERCISE PROGRAM: Shoulder ROM   GOALS: Goals reviewed with patient? Yes  SHORT TERM GOALS: Target date:  01/11/22     Pt will be provided and educated on HEP to improve mobility in RUE required for ADL completion. Goal status: INITIAL  2.  Pt will decrease pain in RUE to 4/10 or less in order to sleep for 4+ consecutive hours without waking due to pain.  Goal status: INITIAL  3.  Pt will increase P/ROM in RUE to [facility] to improve ability to perform dressing tasks with minimal compensatory strategies.  Goal status: INITIAL  4.  Pt will increase strength in RUE to 4/5 to improve ability to tolerate completing lifting required for laundry tasks.  Goal status: INITIAL   LONG TERM GOALS: Target date:  02/03/22     Pt will decrease pain in RUE to 2/10 or less in order to tolerate completing ADL's with limited pain independently.  Goal status:  INITIAL  2.  Pt will decrease RUE fascial restrictions to minimal amounts or less to improve mobility required for overhead reaching tasks.  Goal status: INITIAL  3.  Pt will increase A/ROM of RUE to [facility] to improve ability to reach overhead and behind back during dressing and  bathing tasks. Goal status: INITIAL  4.  Pt will increase strength in RUE to 5/5 to improve ability to perform lifting tasks required during cooking and cleaning tasks. Goal status: INITIAL   ASSESSMENT:  CLINICAL IMPRESSION: Pt reports she did not come to last appointment due to pain and lack of movement. She states that at rest she does not feel pain but with movement pain is 8/10. Completed P/ROM and AA/ROM supine and able to reach [facility] ROM. Progressed to AROM seated on EOB and only able to achieve 50% ROM with flexion and abduction, full ROM with IR/ER. Introduced isometrics against door way and wall to address strengthening. Continue AROM and strengthening exercises next session   PERFORMANCE DEFICITS: in functional skills including ADLs, IADLs, ROM, strength, pain, fascial restrictions, Gross motor control, body mechanics, and UE functional use.  IMPAIRMENTS: are limiting patient from ADLs, IADLs, rest and sleep, leisure, and social participation.   COMORBIDITIES: has no other co-morbidities that affects occupational performance. Patient will benefit from skilled OT to address above impairments and improve overall function.  MODIFICATION OR ASSISTANCE TO COMPLETE EVALUATION: No modification of tasks or assist necessary to complete an evaluation.  OT OCCUPATIONAL PROFILE AND HISTORY: Problem focused assessment: Including review of records relating to presenting problem.  CLINICAL DECISION MAKING: LOW - limited treatment options, no task modification necessary  REHAB POTENTIAL: Good  EVALUATION COMPLEXITY: Low      PLAN:  OT FREQUENCY: 2x/week  OT DURATION: 6 weeks  PLANNED INTERVENTIONS: self  care/ADL training, therapeutic exercise, therapeutic activity, manual therapy, passive range of motion, functional mobility training, ultrasound, moist heat, cryotherapy, and patient/family education  RECOMMENDED OTHER SERVICES: N/A  CONSULTED AND AGREED WITH PLAN OF CARE: Patient  PLAN FOR NEXT SESSION: PROM, AA/ROM, Manual Therapy, Isometrics   Bevelyn Ngo, OT 12/30/2021, 11:13 AM [facility] [phone]

## 2021-12-30 NOTE — Patient Instructions (Signed)
1) ROM: Abduction (Standing)   Bring arms straight out from sides and raise as high as possible without pain. Repeat __10__ times per set. Do ___2_ sets per session.   http://orth.exer.us/910   Copyright  VHI. All rights reserved.   2) Extension (Active) ROM: Extension (Standing)   Bring arms straight back as far as possible without pain. Repeat _10___ times per set. Do __2__ sets per session.  http://orth.exer.us/916   Copyright  VHI. All rights reserved.   3) ROM: External / Internal Rotation - in Abduction (Standing)   With upper arms parallel to floor and elbows bent at right angles, gently rotate arms up then down as far as possible without pain. Repeat _10___ times per set. Do _2___ sets per session.   http://orth.exer.us/912   Copyright  VHI. All rights reserved.    4) Flexors Stretch (Active)   Stand, arms straight at sides. Bring arms straight forward and upward as high as possible without pain. Hold _5__ seconds. Repeat __10_ times per session.   Copyright  VHI. All rights reserved.   5) Scapular Retraction (Standing)   With arms at sides, pinch shoulder blades together. Repeat _10___ times per set. Do __2__ sets per session. .  http://orth.exer.us/944   Copyright  VHI. All rights reserved.

## 2022-01-03 ENCOUNTER — Ambulatory Visit (HOSPITAL_COMMUNITY): Payer: Medicare Other | Admitting: Occupational Therapy

## 2022-01-03 DIAGNOSIS — M25511 Pain in right shoulder: Secondary | ICD-10-CM | POA: Diagnosis not present

## 2022-01-03 DIAGNOSIS — R29898 Other symptoms and signs involving the musculoskeletal system: Secondary | ICD-10-CM

## 2022-01-03 DIAGNOSIS — M25611 Stiffness of right shoulder, not elsewhere classified: Secondary | ICD-10-CM | POA: Diagnosis not present

## 2022-01-03 NOTE — Therapy (Signed)
OUTPATIENT OCCUPATIONAL THERAPY ORTHO TREATMENT  Patient Name: Sabrina Thompson MRN: [id number] DOB:06-25-1955, 66 y.o., female Today's Date: 01/03/2022  PCP: Lupita Raider, NP REFERRING PROVIDER: Oliver Barre, MD  END OF SESSION:   01/03/22 1030  OT Visits / Re-Eval  Visit Number 3  Number of Visits 13  Date for OT Re-Evaluation 02/03/22  Authorization  Authorization Type [employer] Medicare, No Co-pay  Authorization Time Period No visit limit  Progress Note Due on Visit 10  OT Time Calculation  OT Start Time 0945  OT Stop Time 1030  OT Time Calculation (min) 45 min  End of Session  Activity Tolerance Patient tolerated treatment well  Behavior During Therapy [facility] for tasks assessed/performed    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   Depression    Diabetes mellitus    Diabetes mellitus without complication (HCC)    Hypercholesterolemia 11/22/2017   Hyperlipidemia    Hypertension    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   Past Surgical History:  Procedure Laterality Date   BACK SURGERY     lumbar surgery   BACK SURGERY  1998   CHOLECYSTECTOMY  2005   [facility]_Dr Lovell Sheehan   CHOLECYSTECTOMY  1987   DILATION AND CURETTAGE OF UTERUS  1981   dnc  1981   HERNIA REPAIR     umbilical   HERNIA REPAIR  2003   umbilical    INCISIONAL HERNIA REPAIR  09/22/2011   Procedure: HERNIA REPAIR INCISIONAL;  Surgeon: Dalia Heading, MD;  Location: [facility];  Service: General;  Laterality: N/A;   SPINE SURGERY  1998   disc injury   Patient Active Problem List   Diagnosis Date Noted   Callus 12/03/2020   Financial difficulties 02/08/2017   Abnormal Pap smear of cervix 06/30/2016   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   SUI (stress urinary incontinence, female) 03/31/2016   Pain due to onychomycosis of toenails of both feet 03/31/2016   Type 2  diabetes mellitus (HCC) 02/16/2016   Benign essential HTN 02/16/2016   HLD (hyperlipidemia) 02/16/2016   Depression (emotion) 02/16/2016    ONSET DATE: 4+ years ago with progressing severity  REFERRING DIAG: R shoulder Pain  THERAPY DIAG:  No diagnosis found.  Rationale for Evaluation and Treatment: Rehabilitation  SUBJECTIVE:   SUBJECTIVE STATEMENT: "I hurt all the time now" Pt accompanied by: self  PERTINENT HISTORY: Pt received steroid injection on 12/06/21, which she reports has helped with the pain.   PRECAUTIONS: None  WEIGHT BEARING RESTRICTIONS: No  PAIN:  Are you having pain? Without movement, no With movement yes Yes: NPRS scale: 8/10 Pain location: Biceps  Pain description: sharp continual pain Aggravating factors: movement  Relieving factors: rest/no movement, tylenol   FALLS: Has patient fallen in last 6 months? No  LIVING ENVIRONMENT: Lives with: lives alone Lives in: Other Independent Living Facility  PLOF: Independent  PATIENT GOALS: To be able to use my R arm again  OBJECTIVE:   HAND DOMINANCE: Right  ADLs: Overall ADLs: difficulty with dressing and bathing due to RUE ROM limitations. Unable to use the R hand to assist with cooking and cleaning, has a maid that does the heavier cleaning.  FUNCTIONAL OUTCOME MEASURES: FOTO: 55.64  UPPER EXTREMITY ROM:     Active ROM Right eval  Shoulder flexion 63  Shoulder abduction 76  Shoulder internal rotation 90  Shoulder external rotation 25  (Blank rows = not tested)  UPPER EXTREMITY MMT:     MMT Right eval  Shoulder flexion 4-/5  Shoulder abduction 4/5  Shoulder adduction 4+/5  Shoulder extension 4+/5  Shoulder internal rotation 4+/5  Shoulder external rotation 3/5  Elbow flexion 5/5  Elbow extension 5/5  (Blank rows = not tested)  HAND FUNCTION: Grip strength: Right: - lbs; Left: - lbs  EDEMA: Swelling noted around the lower bicep region.  OBSERVATIONS: moderate restriction  noticed in the trapezius, anterior shoulder girdle, and biceps.    TODAY'S TREATMENT:                                                                                                                              DATE:   01/03/22 -Manual:  to address fascial restrictions and improve ROM manual therapy provided to lower/upper bicep, posterior trapezius and anterior shoulder  -P/ROM: seated, flexion, abduction, horizontal abduction, er/IR, 1x10 -AA/ROM: seated, flexion, abduction, protraction, horizontal abduction, er/IR, 1x10 -A/ROM: seated, flexion, abduction, protraction, horizontal abduction, er/IR, 1x5 -Stretching: biceps, doorway stretch  12/30/2021 Manual: to address fascial restrictions and improve ROM manual therapy provided to lower/upper bicep, posterior trapezius and anterior shoulder  P/ROM: Supine: Shoulder flexion, abduction, horizontal abduction IR/ER 10x each way AA/ROM: Supine, Shoulder flexion, abduction 10x each way  AROM: seated on EOB shoulder flexion, abduction, IR/ER 10x each way  Isometrics: facing wall with arm in 90 degree angle with fist on wash cloth pushing into wall 10 seconds 3 sets each way (ER/IR/flexion)    PATIENT EDUCATION: Education details: A/ROM Person educated: Patient Education method: Explanation Education comprehension: verbalized understanding  HOME EXERCISE PROGRAM: 11/17: AA/ROM 11/21: A/ROM  GOALS: Goals reviewed with patient? Yes  SHORT TERM GOALS: Target date:  01/11/22     Pt will be provided and educated on HEP to improve mobility in RUE required for ADL completion. Goal status: IN PROGRESS  2.  Pt will decrease pain in RUE to 4/10 or less in order to sleep for 4+ consecutive hours without waking due to pain.  Goal status: IN PROGRESS  3.  Pt will increase P/ROM in RUE to [facility] to improve ability to perform dressing tasks with minimal compensatory strategies.  Goal status: IN PROGRESS  4.  Pt will increase strength in RUE to  4/5 to improve ability to tolerate completing lifting required for laundry tasks.  Goal status: IN PROGRESS   LONG TERM GOALS: Target date:  02/03/22     Pt will decrease pain in RUE to 2/10 or less in order to tolerate completing ADL's with limited pain independently.  Goal status: IN PROGRESS  2.  Pt will decrease RUE fascial restrictions to minimal amounts or less to improve mobility required for overhead reaching tasks.  Goal status: IN PROGRESS  3.  Pt will increase A/ROM of RUE to [facility] to improve ability to reach  overhead and behind back during dressing and bathing tasks. Goal status: IN PROGRESS  4.  Pt will increase strength in RUE to 5/5 to improve ability to perform lifting tasks required during cooking and cleaning tasks. Goal status: IN PROGRESS   ASSESSMENT:  CLINICAL IMPRESSION: Pt continuing to complain of pain and weakness. Therapist providing manual therapy to breakup some fascial restrictions to assist with pain management before ROM exercises. Pt completing ROM in sitting, however she had increased pain with A/ROM, only able to complete up to 5 reps in each direction. Therapist added stretching to assist with bicep tightness and pain. Therapist providing verbal and tactile cuing for positioning and proper technique.    PLAN:  OT FREQUENCY: 2x/week  OT DURATION: 6 weeks  PLANNED INTERVENTIONS: self care/ADL training, therapeutic exercise, therapeutic activity, manual therapy, passive range of motion, functional mobility training, ultrasound, moist heat, cryotherapy, and patient/family education  RECOMMENDED OTHER SERVICES: N/A  CONSULTED AND AGREED WITH PLAN OF CARE: Patient  PLAN FOR NEXT SESSION: PROM, AA/ROM, A/ROM, Manual Therapy, Isometrics, wall slides, stretches  Trish Mage, OTR/L [facility] [phone]  Kennyth Arnold, OT 01/03/2022, 9:46 AM

## 2022-01-03 NOTE — Patient Instructions (Signed)

## 2022-01-05 ENCOUNTER — Encounter (HOSPITAL_COMMUNITY): Payer: Self-pay | Admitting: Occupational Therapy

## 2022-01-09 ENCOUNTER — Encounter (HOSPITAL_COMMUNITY): Payer: Medicare Other | Admitting: Occupational Therapy

## 2022-01-11 ENCOUNTER — Encounter (HOSPITAL_COMMUNITY): Payer: Medicare Other | Admitting: Occupational Therapy

## 2022-01-13 ENCOUNTER — Encounter (HOSPITAL_COMMUNITY): Payer: Medicare Other | Admitting: Occupational Therapy

## 2022-01-17 ENCOUNTER — Encounter (HOSPITAL_COMMUNITY): Payer: Medicare Other | Admitting: Occupational Therapy

## 2022-01-19 ENCOUNTER — Encounter (HOSPITAL_COMMUNITY): Payer: Medicare Other | Admitting: Occupational Therapy

## 2022-01-24 ENCOUNTER — Encounter (HOSPITAL_COMMUNITY): Payer: Medicare Other | Admitting: Occupational Therapy

## 2022-01-26 ENCOUNTER — Ambulatory Visit (HOSPITAL_COMMUNITY): Payer: Medicare Other | Attending: Orthopedic Surgery | Admitting: Occupational Therapy

## 2022-01-26 ENCOUNTER — Encounter (HOSPITAL_COMMUNITY): Payer: Self-pay | Admitting: Occupational Therapy

## 2022-01-26 DIAGNOSIS — R29898 Other symptoms and signs involving the musculoskeletal system: Secondary | ICD-10-CM | POA: Diagnosis not present

## 2022-01-26 DIAGNOSIS — M25611 Stiffness of right shoulder, not elsewhere classified: Secondary | ICD-10-CM | POA: Insufficient documentation

## 2022-01-26 DIAGNOSIS — M25511 Pain in right shoulder: Secondary | ICD-10-CM | POA: Insufficient documentation

## 2022-01-26 NOTE — Patient Instructions (Signed)

## 2022-01-26 NOTE — Therapy (Signed)
OUTPATIENT OCCUPATIONAL THERAPY ORTHO TREATMENT  Patient Name: Sabrina Thompson MRN: [id number] DOB:03/01/1955, 66 y.o., female Today's Date: 01/26/2022  PCP: Lupita Raider, NP REFERRING PROVIDER: Oliver Barre, MD   OT End of Session - 01/26/22 1035     Visit Number 4    Number of Visits 13    Date for OT Re-Evaluation 02/03/22    Authorization Type [employer] Medicare, No Co-pay    Authorization Time Period No visit limit    Progress Note Due on Visit 10    OT Start Time 1035    OT Stop Time 1115    OT Time Calculation (min) 40 min    Activity Tolerance Patient tolerated treatment well    Behavior During Therapy WFL for tasks assessed/performed           END OF SESSION:  OT End of Session - 01/26/22 1035     Visit Number 4    Number of Visits 13    Date for OT Re-Evaluation 02/03/22    Authorization Type UHC Medicare, No Co-pay    Authorization Time Period No visit limit    Progress Note Due on Visit 10    OT Start Time 1035    OT Stop Time 1115    OT Time Calculation (min) 40 min    Activity Tolerance Patient tolerated treatment well    Behavior During Therapy [facility] for tasks assessed/performed            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   Depression    Diabetes mellitus    Diabetes mellitus without complication (HCC)    Hypercholesterolemia 11/22/2017   Hyperlipidemia    Hypertension    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   Past Surgical History:  Procedure Laterality Date   BACK SURGERY     lumbar surgery   BACK SURGERY  1998   CHOLECYSTECTOMY  2005   [facility]_Dr Lovell Sheehan   CHOLECYSTECTOMY  1987   DILATION AND CURETTAGE OF UTERUS  1981   dnc  1981   HERNIA REPAIR     umbilical   HERNIA REPAIR  2003   umbilical    INCISIONAL HERNIA REPAIR  09/22/2011   Procedure: HERNIA REPAIR INCISIONAL;  Surgeon: Dalia Heading, MD;  Location: [facility];  Service: General;  Laterality: N/A;   SPINE SURGERY  1998   disc injury   Patient Active Problem List   Diagnosis Date Noted   Callus 12/03/2020   Financial difficulties 02/08/2017   Abnormal Pap smear of cervix 06/30/2016   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   SUI (stress urinary incontinence, female) 03/31/2016   Pain due to onychomycosis of toenails of both feet 03/31/2016   Type 2 diabetes mellitus (HCC) 02/16/2016   Benign essential HTN 02/16/2016   HLD (hyperlipidemia) 02/16/2016   Depression (emotion) 02/16/2016    ONSET DATE: 4+ years ago with progressing severity  REFERRING DIAG: R shoulder Pain  THERAPY DIAG:  Acute pain of right shoulder  Stiffness of right shoulder, not elsewhere classified  Other symptoms and signs involving the musculoskeletal system  Rationale for Evaluation and Treatment: Rehabilitation  SUBJECTIVE:   SUBJECTIVE STATEMENT: "I hurt all the time now" Pt accompanied by: self  PERTINENT HISTORY: Pt received steroid injection on 12/06/21, which she reports has helped with the  pain.   PRECAUTIONS: None  WEIGHT BEARING RESTRICTIONS: No  PAIN:  Are you having pain? Without movement, no With movement yes Yes: NPRS scale: 8/10 Pain location: Biceps  Pain description: sharp continual pain Aggravating factors: movement  Relieving factors: rest/no movement, tylenol   FALLS: Has patient fallen in last 6 months? No  LIVING ENVIRONMENT: Lives with: lives alone Lives in: Other [place]  PLOF: Independent  PATIENT GOALS: To be able to use my R arm again  OBJECTIVE:   HAND DOMINANCE: Right  ADLs: Overall ADLs: difficulty with dressing and bathing due to RUE ROM limitations. Unable to use the R hand to assist with cooking and cleaning, has a maid that does the heavier cleaning.  FUNCTIONAL OUTCOME MEASURES: FOTO: 55.64  UPPER EXTREMITY ROM:     Active ROM Right eval  Shoulder  flexion 63  Shoulder abduction 76  Shoulder internal rotation 90  Shoulder external rotation 25  (Blank rows = not tested)  UPPER EXTREMITY MMT:     MMT Right eval  Shoulder flexion 4-/5  Shoulder abduction 4/5  Shoulder adduction 4+/5  Shoulder extension 4+/5  Shoulder internal rotation 4+/5  Shoulder external rotation 3/5  Elbow flexion 5/5  Elbow extension 5/5  (Blank rows = not tested)  HAND FUNCTION: Grip strength: Right: - lbs; Left: - lbs  EDEMA: Swelling noted around the lower bicep region.  OBSERVATIONS: moderate restriction noticed in the trapezius, anterior shoulder girdle, and biceps.    TODAY'S TREATMENT:                                                                                                                              DATE:   01/26/22 -AA/ROM: seated, flexion, abduction, protraction, horizontal abduction, er/IR, 1x10 -A/ROM: seated, flexion, abduction, protraction, horizontal abduction, er/IR, 1x10 -Scapula Strengthening: red theraband, extension, retraction, protraction, rows, 1x10 -Ball on the wall: up and down rolling, flexion and abduction, x10 -Functional Reaching: placing squigs up on a mirror overhead,  x14  01/03/22 -Manual:  to address fascial restrictions and improve ROM manual therapy provided to lower/upper bicep, posterior trapezius and anterior shoulder  -P/ROM: seated, flexion, abduction, horizontal abduction, er/IR, 1x10 -AA/ROM: seated, flexion, abduction, protraction, horizontal abduction, er/IR, 1x10 -A/ROM: seated, flexion, abduction, protraction, horizontal abduction, er/IR, 1x5 -Stretching: biceps, doorway stretch  12/30/2021 Manual: to address fascial restrictions and improve ROM manual therapy provided to lower/upper bicep, posterior trapezius and anterior shoulder  P/ROM: Supine: Shoulder flexion, abduction, horizontal abduction IR/ER 10x each way AA/ROM: Supine, Shoulder flexion, abduction 10x each way  AROM: seated on  EOB shoulder flexion, abduction, IR/ER 10x each way  Isometrics: facing wall with arm in 90 degree angle with fist on wash cloth pushing into wall 10 seconds 3 sets each way (ER/IR/flexion)    PATIENT EDUCATION: Education details: Physiological scientist Person educated: Patient Education method: Explanation Education comprehension: verbalized understanding  HOME EXERCISE PROGRAM: 11/17: AA/ROM 11/21: A/ROM 12/14: scapula strengthening  GOALS:  Goals reviewed with patient? Yes  SHORT TERM GOALS: Target date:  01/11/22     Pt will be provided and educated on HEP to improve mobility in RUE required for ADL completion. Goal status: IN PROGRESS  2.  Pt will decrease pain in RUE to 4/10 or less in order to sleep for 4+ consecutive hours without waking due to pain.  Goal status: IN PROGRESS  3.  Pt will increase P/ROM in RUE to [facility] to improve ability to perform dressing tasks with minimal compensatory strategies.  Goal status: IN PROGRESS  4.  Pt will increase strength in RUE to 4/5 to improve ability to tolerate completing lifting required for laundry tasks.  Goal status: IN PROGRESS   LONG TERM GOALS: Target date:  02/03/22     Pt will decrease pain in RUE to 2/10 or less in order to tolerate completing ADL's with limited pain independently.  Goal status: IN PROGRESS  2.  Pt will decrease RUE fascial restrictions to minimal amounts or less to improve mobility required for overhead reaching tasks.  Goal status: IN PROGRESS  3.  Pt will increase A/ROM of RUE to [facility] to improve ability to reach overhead and behind back during dressing and bathing tasks. Goal status: IN PROGRESS  4.  Pt will increase strength in RUE to 5/5 to improve ability to perform lifting tasks required during cooking and cleaning tasks. Goal status: IN PROGRESS   ASSESSMENT:  CLINICAL IMPRESSION: Pt continuing to have pain, however her ROM is improving. Did not complete Manual therapy this session due to  pt reporting that manual is increasing her pain. AS she continues exercises, she demonstrates muscle fatigue, due to her ROM decreasing and speed decreasing. She requires frequent rest breaks due fatigue and pain. Therapist providing verbal and tactile cuing for positioning and technique.   PLAN:  OT FREQUENCY: 2x/week  OT DURATION: 6 weeks  PLANNED INTERVENTIONS: self care/ADL training, therapeutic exercise, therapeutic activity, manual therapy, passive range of motion, functional mobility training, ultrasound, moist heat, cryotherapy, and patient/family education  RECOMMENDED OTHER SERVICES: N/A  CONSULTED AND AGREED WITH PLAN OF CARE: Patient  PLAN FOR NEXT SESSION: PROM, AA/ROM, A/ROM, Manual Therapy, Isometrics, wall slides, stretches  Paulita Fujita, OTR/L [facility] [place], [place] 01/26/2022, 10:36 AM

## 2022-01-31 ENCOUNTER — Encounter (HOSPITAL_COMMUNITY): Payer: Self-pay | Admitting: Occupational Therapy

## 2022-01-31 ENCOUNTER — Ambulatory Visit (HOSPITAL_COMMUNITY): Payer: Medicare Other | Admitting: Occupational Therapy

## 2022-01-31 DIAGNOSIS — M25511 Pain in right shoulder: Secondary | ICD-10-CM

## 2022-01-31 DIAGNOSIS — R29898 Other symptoms and signs involving the musculoskeletal system: Secondary | ICD-10-CM | POA: Diagnosis not present

## 2022-01-31 DIAGNOSIS — M25611 Stiffness of right shoulder, not elsewhere classified: Secondary | ICD-10-CM

## 2022-01-31 NOTE — Therapy (Signed)
OUTPATIENT OCCUPATIONAL THERAPY ORTHO TREATMENT  Patient Name: Sabrina Thompson MRN: [id number] DOB:11-21-1955, 66 y.o., female Today's Date: 01/31/2022  PCP: Lupita Raider, NP REFERRING PROVIDER: Oliver Barre, MD  END OF SESSION:  OT End of Session - 01/31/22 1119     Visit Number 5    Number of Visits 13    Date for OT Re-Evaluation 02/03/22    Authorization Type UHC Medicare, No Co-pay    Authorization Time Period No visit limit    Progress Note Due on Visit 10    OT Start Time 1120    OT Stop Time 1200    OT Time Calculation (min) 40 min    Activity Tolerance Patient tolerated treatment well    Behavior During Therapy [facility] for tasks assessed/performed            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   Depression    Diabetes mellitus    Diabetes mellitus without complication (HCC)    Hypercholesterolemia 11/22/2017   Hyperlipidemia    Hypertension    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   Past Surgical History:  Procedure Laterality Date   BACK SURGERY     lumbar surgery   BACK SURGERY  1998   CHOLECYSTECTOMY  2005   [facility]_Dr Lovell Sheehan   CHOLECYSTECTOMY  1987   DILATION AND CURETTAGE OF UTERUS  1981   dnc  1981   HERNIA REPAIR     umbilical   HERNIA REPAIR  2003   umbilical    INCISIONAL HERNIA REPAIR  09/22/2011   Procedure: HERNIA REPAIR INCISIONAL;  Surgeon: Dalia Heading, MD;  Location: [facility];  Service: General;  Laterality: N/A;   SPINE SURGERY  1998   disc injury   Patient Active Problem List   Diagnosis Date Noted   Callus 12/03/2020   Financial difficulties 02/08/2017   Abnormal Pap smear of cervix 06/30/2016   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   SUI (stress urinary incontinence, female) 03/31/2016   Pain due to onychomycosis of toenails of both feet 03/31/2016   Type 2 diabetes mellitus (HCC)  02/16/2016   Benign essential HTN 02/16/2016   HLD (hyperlipidemia) 02/16/2016   Depression (emotion) 02/16/2016    ONSET DATE: 4+ years ago with progressing severity  REFERRING DIAG: R shoulder Pain  THERAPY DIAG:  Acute pain of right shoulder  Stiffness of right shoulder, not elsewhere classified  Other symptoms and signs involving the musculoskeletal system  Rationale for Evaluation and Treatment: Rehabilitation  SUBJECTIVE:   SUBJECTIVE STATEMENT: "Lifting my arm overhead just doesn't happen" Pt accompanied by: self  PERTINENT HISTORY: Pt received steroid injection on 12/06/21, which she reports has helped with the pain.   PRECAUTIONS: None  WEIGHT BEARING RESTRICTIONS: No  PAIN:  Are you having pain? Without movement, no With movement yes Yes: NPRS scale: 8/10 Pain location: Biceps  Pain description: sharp continual pain Aggravating factors: movement  Relieving factors: rest/no movement, tylenol   FALLS: Has patient fallen in last 6 months? No  LIVING ENVIRONMENT: Lives with: lives alone Lives in: Other Independent Living Facility  PLOF: Independent  PATIENT GOALS: To be able to use my R arm again  OBJECTIVE:   HAND DOMINANCE: Right  ADLs: Overall ADLs: difficulty with dressing and bathing due to RUE ROM limitations. Unable to use  the R hand to assist with cooking and cleaning, has a maid that does the heavier cleaning.  FUNCTIONAL OUTCOME MEASURES: FOTO: 55.64  UPPER EXTREMITY ROM:     Active ROM Right eval  Shoulder flexion 63  Shoulder abduction 76  Shoulder internal rotation 90  Shoulder external rotation 25  (Blank rows = not tested)  UPPER EXTREMITY MMT:     MMT Right eval  Shoulder flexion 4-/5  Shoulder abduction 4/5  Shoulder adduction 4+/5  Shoulder extension 4+/5  Shoulder internal rotation 4+/5  Shoulder external rotation 3/5  Elbow flexion 5/5  Elbow extension 5/5  (Blank rows = not tested)  HAND FUNCTION: Grip  strength: Right: - lbs; Left: - lbs  EDEMA: Swelling noted around the lower bicep region.  OBSERVATIONS: moderate restriction noticed in the trapezius, anterior shoulder girdle, and biceps.    TODAY'S TREATMENT:                                                                                                                              DATE:   01/31/22 -AA/ROM: seated, flexion, abduction, protraction, horizontal abduction, er/IR, 1x10 -A/ROM: supine, flexion, abduction, protraction, horizontal abduction, er/IR, 1x10 -Educated on heat and K-taping for bicep pain and tightness -Stretching: bicep  on the wall, doorway stretch -UBE: 2.5 mins forward, 2.5 mins backwards, level 1 resistance, 4.0+ RPM  01/26/22 -AA/ROM: seated, flexion, abduction, protraction, horizontal abduction, er/IR, 1x10 -A/ROM: seated, flexion, abduction, protraction, horizontal abduction, er/IR, 1x10 -Scapula Strengthening: red theraband, extension, retraction, protraction, rows, 1x10 -Ball on the wall: up and down rolling, flexion and abduction, x10 -Functional Reaching: placing squigs up on a mirror overhead,  x14  01/03/22 -Manual:  to address fascial restrictions and improve ROM manual therapy provided to lower/upper bicep, posterior trapezius and anterior shoulder  -P/ROM: seated, flexion, abduction, horizontal abduction, er/IR, 1x10 -AA/ROM: seated, flexion, abduction, protraction, horizontal abduction, er/IR, 1x10 -A/ROM: seated, flexion, abduction, protraction, horizontal abduction, er/IR, 1x5 -Stretching: biceps, doorway stretch   PATIENT EDUCATION: Education details: Reviewed stretches for biceps Person educated: Patient Education method: Explanation Education comprehension: verbalized understanding  HOME EXERCISE PROGRAM: 11/17: AA/ROM 11/21: A/ROM 12/14: scapula strengthening  GOALS: Goals reviewed with patient? Yes  SHORT TERM GOALS: Target date:  01/11/22     Pt will be provided and  educated on HEP to improve mobility in RUE required for ADL completion. Goal status: IN PROGRESS  2.  Pt will decrease pain in RUE to 4/10 or less in order to sleep for 4+ consecutive hours without waking due to pain.  Goal status: IN PROGRESS  3.  Pt will increase P/ROM in RUE to [facility] to improve ability to perform dressing tasks with minimal compensatory strategies.  Goal status: IN PROGRESS  4.  Pt will increase strength in RUE to 4/5 to improve ability to tolerate completing lifting required for laundry tasks.  Goal status: IN PROGRESS   LONG TERM GOALS: Target date:  02/03/22  Pt will decrease pain in RUE to 2/10 or less in order to tolerate completing ADL's with limited pain independently.  Goal status: IN PROGRESS  2.  Pt will decrease RUE fascial restrictions to minimal amounts or less to improve mobility required for overhead reaching tasks.  Goal status: IN PROGRESS  3.  Pt will increase A/ROM of RUE to [facility] to improve ability to reach overhead and behind back during dressing and bathing tasks. Goal status: IN PROGRESS  4.  Pt will increase strength in RUE to 5/5 to improve ability to perform lifting tasks required during cooking and cleaning tasks. Goal status: IN PROGRESS   ASSESSMENT:  CLINICAL IMPRESSION: Pt presenting with increased pain in the bicep region, along with increased muscle tightness and fascial restrictions. Manual therapy continues to cause increased pain, therefore OT did not complete again this session. Pt was provided education on heating her arm multiple times a day for pain relief, along with potential application of k-taping next session, which she is agreeable to. Therapist had pt return to supine for A/ROM to limit gravity resistance and improve ROM with no increase in pain. OT providing min-mod verbal and tactile cuing for proper mechanics and technique throughout all exercises this session.    PLAN:  OT FREQUENCY: 2x/week  OT DURATION: 6  weeks  PLANNED INTERVENTIONS: self care/ADL training, therapeutic exercise, therapeutic activity, manual therapy, passive range of motion, functional mobility training, ultrasound, moist heat, cryotherapy, and patient/family education  RECOMMENDED OTHER SERVICES: N/A  CONSULTED AND AGREED WITH PLAN OF CARE: Patient  PLAN FOR NEXT SESSION: PROM, AA/ROM, A/ROM, Manual Therapy, Isometrics, wall slides, stretches, scapular strengthening, Concepcion Living, OTR/L [facility] [phone]  Kennyth Arnold, OT 01/31/2022, 11:45 AM

## 2022-02-02 ENCOUNTER — Encounter (HOSPITAL_COMMUNITY): Payer: Self-pay | Admitting: Occupational Therapy

## 2022-02-02 ENCOUNTER — Ambulatory Visit (HOSPITAL_COMMUNITY): Payer: Medicare Other | Admitting: Occupational Therapy

## 2022-02-02 DIAGNOSIS — M25511 Pain in right shoulder: Secondary | ICD-10-CM

## 2022-02-02 DIAGNOSIS — M25611 Stiffness of right shoulder, not elsewhere classified: Secondary | ICD-10-CM

## 2022-02-02 DIAGNOSIS — R29898 Other symptoms and signs involving the musculoskeletal system: Secondary | ICD-10-CM

## 2022-02-02 NOTE — Therapy (Signed)
OUTPATIENT OCCUPATIONAL THERAPY ORTHO TREATMENT  Patient Name: Sabrina Thompson MRN: [id number] DOB:Jul 04, 1955, 66 y.o., female Today's Date: 02/02/2022  PCP: Lupita Raider, NP REFERRING PROVIDER: Oliver Barre, MD  END OF SESSION:  OT End of Session - 02/02/22 1034     Visit Number 6    Number of Visits 13    Date for OT Re-Evaluation 02/03/22    Authorization Type UHC Medicare, No Co-pay    Authorization Time Period No visit limit    Progress Note Due on Visit 10    OT Start Time 1032    OT Stop Time 1110    OT Time Calculation (min) 38 min    Activity Tolerance Patient tolerated treatment well    Behavior During Therapy WFL for tasks assessed/performed            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   Depression    Diabetes mellitus    Diabetes mellitus without complication (HCC)    Hypercholesterolemia 11/22/2017   Hyperlipidemia    Hypertension    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   Past Surgical History:  Procedure Laterality Date   BACK SURGERY     lumbar surgery   BACK SURGERY  1998   CHOLECYSTECTOMY  2005   [facility]_Dr Lovell Sheehan   CHOLECYSTECTOMY  1987   DILATION AND CURETTAGE OF UTERUS  1981   dnc  1981   HERNIA REPAIR     umbilical   HERNIA REPAIR  2003   umbilical    INCISIONAL HERNIA REPAIR  09/22/2011   Procedure: HERNIA REPAIR INCISIONAL;  Surgeon: Dalia Heading, MD;  Location: [facility];  Service: General;  Laterality: N/A;   SPINE SURGERY  1998   disc injury   Patient Active Problem List   Diagnosis Date Noted   Callus 12/03/2020   Financial difficulties 02/08/2017   Abnormal Pap smear of cervix 06/30/2016   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   SUI (stress urinary incontinence, female) 03/31/2016   Pain due to onychomycosis of toenails of both feet 03/31/2016   Type 2 diabetes mellitus (HCC)  02/16/2016   Benign essential HTN 02/16/2016   HLD (hyperlipidemia) 02/16/2016   Depression (emotion) 02/16/2016    ONSET DATE: 4+ years ago with progressing severity  REFERRING DIAG: R shoulder Pain  THERAPY DIAG:  Acute pain of right shoulder  Stiffness of right shoulder, not elsewhere classified  Other symptoms and signs involving the musculoskeletal system  Rationale for Evaluation and Treatment: Rehabilitation  SUBJECTIVE:   SUBJECTIVE STATEMENT: "Lifting my arm overhead just doesn't happen" Pt accompanied by: self  PERTINENT HISTORY: Pt received steroid injection on 12/06/21, which she reports has helped with the pain.   PRECAUTIONS: None  WEIGHT BEARING RESTRICTIONS: No  PAIN:  Are you having pain? Without movement, no With movement yes Yes: NPRS scale: 8/10 Pain location: Biceps  Pain description: sharp continual pain Aggravating factors: movement  Relieving factors: rest/no movement, tylenol   FALLS: Has patient fallen in last 6 months? No  LIVING ENVIRONMENT: Lives with: lives alone Lives in: Other Independent Living Facility  PLOF: Independent  PATIENT GOALS: To be able to use my R arm again  OBJECTIVE:   HAND DOMINANCE: Right  ADLs: Overall ADLs: difficulty with dressing and bathing due to RUE ROM limitations. Unable to use  the R hand to assist with cooking and cleaning, has a maid that does the heavier cleaning.  FUNCTIONAL OUTCOME MEASURES: FOTO: 55.64  UPPER EXTREMITY ROM:     Active ROM Right eval  Shoulder flexion 63  Shoulder abduction 76  Shoulder internal rotation 90  Shoulder external rotation 25  (Blank rows = not tested)  UPPER EXTREMITY MMT:     MMT Right eval  Shoulder flexion 4-/5  Shoulder abduction 4/5  Shoulder adduction 4+/5  Shoulder extension 4+/5  Shoulder internal rotation 4+/5  Shoulder external rotation 3/5  Elbow flexion 5/5  Elbow extension 5/5  (Blank rows = not tested)  HAND FUNCTION: Grip  strength: Right: - lbs; Left: - lbs  EDEMA: Swelling noted around the lower bicep region.  OBSERVATIONS: moderate restriction noticed in the trapezius, anterior shoulder girdle, and biceps.    TODAY'S TREATMENT:                                                                                                                              DATE:   02/02/22 -AA/ROM: seated, flexion, abduction, protraction, horizontal abduction, er/IR, 1x10 -A/ROM: supine, flexion, abduction, protraction, horizontal abduction, er/IR, 1x10 -Functional reaching: reaching to top cabinets 1 and 2 to pull off and place back up 10 items from each shelf, 2lb wrist weights donned for activity -Rebounder: red weighted ball x90" and yellow weighted ball x60" -K-tape: placed below anterior elbow joint with 75% stretch up to anterior shoulder girdle/clavicle region. Tape cut in half above elbow joint and placed on either side of biceps, up to anterior shoulder girdle where each strap crosses. 3in tape placed horizontally across the insertion point of the biceps with 50% stretch.  01/31/22 -AA/ROM: seated, flexion, abduction, protraction, horizontal abduction, er/IR, 1x10 -A/ROM: supine, flexion, abduction, protraction, horizontal abduction, er/IR, 1x10 -Educated on heat and K-taping for bicep pain and tightness -Stretching: bicep  on the wall, doorway stretch -UBE: 2.5 mins forward, 2.5 mins backwards, level 1 resistance, 4.0+ RPM  01/26/22 -AA/ROM: seated, flexion, abduction, protraction, horizontal abduction, er/IR, 1x10 -A/ROM: seated, flexion, abduction, protraction, horizontal abduction, er/IR, 1x10 -Scapula Strengthening: red theraband, extension, retraction, protraction, rows, 1x10 -Ball on the wall: up and down rolling, flexion and abduction, x10 -Functional Reaching: placing squigs up on a mirror overhead,  x14   PATIENT EDUCATION: Education details: K-taping and reviewed HEP Person educated:  Patient Education method: Explanation Education comprehension: verbalized understanding  HOME EXERCISE PROGRAM: 11/17: AA/ROM 11/21: A/ROM 12/14: scapula strengthening 12/19: Stretches (bicep stretch, er stretch, doorway stretch)  GOALS: Goals reviewed with patient? Yes  SHORT TERM GOALS: Target date:  01/11/22     Pt will be provided and educated on HEP to improve mobility in RUE required for ADL completion. Goal status: IN PROGRESS  2.  Pt will decrease pain in RUE to 4/10 or less in order to sleep for 4+ consecutive hours without waking due to pain.  Goal status: IN PROGRESS  3.  Pt will increase P/ROM in RUE to [facility] to improve ability to perform dressing tasks with minimal compensatory strategies.  Goal status: IN PROGRESS  4.  Pt will increase strength in RUE to 4/5 to improve ability to tolerate completing lifting required for laundry tasks.  Goal status: IN PROGRESS   LONG TERM GOALS: Target date:  02/03/22     Pt will decrease pain in RUE to 2/10 or less in order to tolerate completing ADL's with limited pain independently.  Goal status: IN PROGRESS  2.  Pt will decrease RUE fascial restrictions to minimal amounts or less to improve mobility required for overhead reaching tasks.  Goal status: IN PROGRESS  3.  Pt will increase A/ROM of RUE to [facility] to improve ability to reach overhead and behind back during dressing and bathing tasks. Goal status: IN PROGRESS  4.  Pt will increase strength in RUE to 5/5 to improve ability to perform lifting tasks required during cooking and cleaning tasks. Goal status: IN PROGRESS   ASSESSMENT:  CLINICAL IMPRESSION: Pt presenting with continued increased pain in the bicep region. This session, therapist applied K-tape at the end of the session as an attempt to support the bicep muscles, encourage increased circulation, and reduce pain and muscle tension. Pt requiring mod verbal cuing to reduce compensatory strategies, such as tensing  and elevating shoulder. Due to her increased pain, pt having difficulty with all reaching and ROM tasks.    PLAN:  OT FREQUENCY: 2x/week  OT DURATION: 6 weeks  PLANNED INTERVENTIONS: self care/ADL training, therapeutic exercise, therapeutic activity, manual therapy, passive range of motion, functional mobility training, ultrasound, moist heat, cryotherapy, and patient/family education  RECOMMENDED OTHER SERVICES: N/A  CONSULTED AND AGREED WITH PLAN OF CARE: Patient  PLAN FOR NEXT SESSION: PROM, AA/ROM, A/ROM, Manual Therapy, Isometrics, wall slides, stretches, scapular strengthening, check on KTAPE  Trish Mage, OTR/L [facility] Out[facility] Re[facility] [phone]  Sabrina Thompson Rosemarie Beath, OT 02/02/2022, 10:35 AM

## 2022-02-08 ENCOUNTER — Encounter (HOSPITAL_COMMUNITY): Payer: Medicare Other | Admitting: Occupational Therapy

## 2022-02-20 ENCOUNTER — Ambulatory Visit: Payer: Medicare Other | Admitting: Podiatry

## 2022-02-20 ENCOUNTER — Ambulatory Visit (INDEPENDENT_AMBULATORY_CARE_PROVIDER_SITE_OTHER): Payer: 59

## 2022-02-20 ENCOUNTER — Ambulatory Visit (INDEPENDENT_AMBULATORY_CARE_PROVIDER_SITE_OTHER): Payer: 59 | Admitting: Orthopedic Surgery

## 2022-02-20 ENCOUNTER — Encounter: Payer: Self-pay | Admitting: Orthopedic Surgery

## 2022-02-20 DIAGNOSIS — M79644 Pain in right finger(s): Secondary | ICD-10-CM

## 2022-02-20 DIAGNOSIS — G8929 Other chronic pain: Secondary | ICD-10-CM | POA: Diagnosis not present

## 2022-02-20 DIAGNOSIS — M65311 Trigger thumb, right thumb: Secondary | ICD-10-CM | POA: Diagnosis not present

## 2022-02-20 DIAGNOSIS — M25511 Pain in right shoulder: Secondary | ICD-10-CM | POA: Diagnosis not present

## 2022-02-20 NOTE — Progress Notes (Signed)
New Patient Visit  Assessment: Sabrina Thompson is a 67 y.o. female with the following: 1. Chronic right shoulder pain  Plan: Sabrina Thompson has chronic pain in her right shoulder.  Injection provided relief of her symptoms for 1-2 months.  She has done physical therapy, but her pain has gotten worse.  She has limited overhead motion.  She also has weakness in her right shoulder.  We will obtain an MRI of her right shoulder, and meet with her discussed the results.  She is also having tenderness, swelling and pain around the base of the right thumb.  She is very tender over the A1 pulley.  She describes occasional catching sensations.  Symptoms most consistent with a trigger thumb.  This was injected in clinic today.  Procedure note injection - Right Thumb A1 Pulley  Verbal consent was obtained to inject the Right Thumb A1 pulley Timeout was completed to confirm the site of injection.  The skin was prepped with alcohol and ethyl chloride was sprayed at the injection site.  A 21-gauge needle was used to inject 40 mg of Depo-Medrol and 1% lidocaine (1 cc) into the Right Thumb using a direct anterior approach.  There were no complications. Patient tolerated the procedure well. A sterile bandage was applied    Follow-up: Return for After MRI.  Subjective:  Chief Complaint  Patient presents with   Shoulder Pain    Right / painful decrease ROM   Hand Pain    Right thumb painful swollen no injury     History of Present Illness: Sabrina Thompson is a 67 y.o. female who returns to clinic today for repeat evaluation of right shoulder.  Injections helped her symptoms for 1-2 months.  However, when she started physical therapy, she noticed worsening of her pain.  Her function is no better.  She has difficulty with overhead motion.  She is also starting to have pain, swelling and catching sensation in her right thumb.  This is been ongoing for the past 3 to 4 weeks.  She has tenderness in the right  thumb.  Difficulty gripping items.  Review of Systems: No fevers or chills No numbness or tingling No chest pain No shortness of breath No bowel or bladder dysfunction No GI distress No headaches     Objective: LMP 02/13/2005 (Approximate)   Physical Exam:  General: Alert and oriented. and No acute distress. Gait: Normal gait.  Right shoulder without deformity.  No atrophy is appreciated.  Forward flexion is limited to 90 degrees.  90 degrees of abduction at her side.  Passively I can get her to 140 degrees before it is too painful.  Positive drop arm test.  Fingers are warm and well-perfused.  2+ radial pulse.  Positive Jobe's.  Positive Hawkins.  Right thumb with mild swelling.  No redness.  Exquisite tenderness to palpation over the A1 pulley to the thumb.  No tenderness elsewhere.  Decreased grip strength as result.   IMAGING: I personally ordered and reviewed the following images  X-ray of the right thumb was obtained in clinic today.  No acute injuries are noted.  Mild degenerative changes at the first [facility] joint.  Mild degenerative changes at the first MCP joint.  Small osteophytes are appreciated.  No subluxation.  No dislocation.  No bony lesions.  Impression: Right thumb with mild degenerative changes  New Medications:  No orders of the defined types were placed in this encounter.     Mordecai Rasmussen,  MD  02/20/2022 9:12 AM

## 2022-02-20 NOTE — Patient Instructions (Signed)

## 2022-03-01 DIAGNOSIS — E1165 Type 2 diabetes mellitus with hyperglycemia: Secondary | ICD-10-CM | POA: Diagnosis not present

## 2022-03-01 DIAGNOSIS — I10 Essential (primary) hypertension: Secondary | ICD-10-CM | POA: Diagnosis not present

## 2022-03-07 DIAGNOSIS — E1165 Type 2 diabetes mellitus with hyperglycemia: Secondary | ICD-10-CM | POA: Diagnosis not present

## 2022-03-07 DIAGNOSIS — I10 Essential (primary) hypertension: Secondary | ICD-10-CM | POA: Diagnosis not present

## 2022-03-07 DIAGNOSIS — E1140 Type 2 diabetes mellitus with diabetic neuropathy, unspecified: Secondary | ICD-10-CM | POA: Diagnosis not present

## 2022-03-07 DIAGNOSIS — E871 Hypo-osmolality and hyponatremia: Secondary | ICD-10-CM | POA: Diagnosis not present

## 2022-03-07 DIAGNOSIS — N3281 Overactive bladder: Secondary | ICD-10-CM | POA: Diagnosis not present

## 2022-03-07 DIAGNOSIS — E782 Mixed hyperlipidemia: Secondary | ICD-10-CM | POA: Diagnosis not present

## 2022-03-07 DIAGNOSIS — L682 Localized hypertrichosis: Secondary | ICD-10-CM | POA: Diagnosis not present

## 2022-03-07 DIAGNOSIS — D649 Anemia, unspecified: Secondary | ICD-10-CM | POA: Diagnosis not present

## 2022-03-07 DIAGNOSIS — Z0001 Encounter for general adult medical examination with abnormal findings: Secondary | ICD-10-CM | POA: Diagnosis not present

## 2022-03-07 DIAGNOSIS — M21372 Foot drop, left foot: Secondary | ICD-10-CM | POA: Diagnosis not present

## 2022-03-14 ENCOUNTER — Encounter: Payer: Self-pay | Admitting: Podiatry

## 2022-03-14 ENCOUNTER — Ambulatory Visit (INDEPENDENT_AMBULATORY_CARE_PROVIDER_SITE_OTHER): Payer: 59 | Admitting: Podiatry

## 2022-03-14 DIAGNOSIS — B351 Tinea unguium: Secondary | ICD-10-CM

## 2022-03-14 DIAGNOSIS — L84 Corns and callosities: Secondary | ICD-10-CM

## 2022-03-14 DIAGNOSIS — M79674 Pain in right toe(s): Secondary | ICD-10-CM

## 2022-03-14 DIAGNOSIS — M79675 Pain in left toe(s): Secondary | ICD-10-CM

## 2022-03-14 DIAGNOSIS — E1142 Type 2 diabetes mellitus with diabetic polyneuropathy: Secondary | ICD-10-CM

## 2022-03-14 NOTE — Progress Notes (Signed)
  Subjective:  Patient ID: Sabrina Thompson, female    DOB: Jul 04, 1955,  MRN: [id number]  No chief complaint on file.  67 y.o. female presents oday for toenail trim with concern of thickened elongated and painful nails that are difficult to trim. Requesting to have them trimmed today. Relates burning and tingling in their feet. Patient is diabetic and last A1c was  Lab Results  Component Value Date   HGBA1C 6.8 (H) 06/30/2016   .   PCP:  Sabrina Sheerer, Sabrina Thompson   Denies any other pedal complaints.   Objective:  Physical Exam: warm, good capillary refill, nail exam normal nails without lesions, no trophic changes or ulcerative lesions. DP pulses palpable, PT pulses palpable, and protective sensation absent Left Foot: hammertoe deformities noted; dropfoot left. 2nd toe rigid deformity, hallux IPJ exostosis noted. 3rd-5th toes reducible. Right Foot: hammertoe deformities noted Nails 1-5 b/l are thickened and elongated with subungual debris. Hyperkeratotic lesion noted sub first metatarsal on left.   No images are attached to the encounter.  Assessment:   1. Pain due to onychomycosis of toenails of both feet   2. Callus   3. DM type 2 with diabetic peripheral neuropathy (Sabrina Thompson)       Plan:  Patient was evaluated and treated and all questions answered.  -Discussed and educated patient on diabetic foot care, especially with  regards to the vascular, neurological and musculoskeletal systems.  -Stressed the importance of good glycemic control and the detriment of not  controlling glucose levels in relation to the foot. -Discussed supportive shoes at all times and checking feet regularly.  -Mechanically debrided all nails 1-5 bilateral using sterile nail nipper and filed with dremel without incident  -Hyperkeratotic tissue debrided without incident with chisel.  -Answered all patient questions -Patient to return  in 3 months for at risk foot care -Patient advised to call the office if any  problems or questions arise in the meantime.      No follow-ups on file.

## 2022-03-15 ENCOUNTER — Encounter: Payer: Self-pay | Admitting: Podiatry

## 2022-03-15 ENCOUNTER — Ambulatory Visit (HOSPITAL_COMMUNITY)
Admission: RE | Admit: 2022-03-15 | Discharge: 2022-03-15 | Disposition: A | Discharge status: Home / Self Care | Payer: 59 | Source: Ambulatory Visit | Attending: Orthopedic Surgery | Admitting: Orthopedic Surgery

## 2022-03-15 DIAGNOSIS — M2041 Other hammer toe(s) (acquired), right foot: Secondary | ICD-10-CM

## 2022-03-15 DIAGNOSIS — G8929 Other chronic pain: Secondary | ICD-10-CM

## 2022-03-15 DIAGNOSIS — L84 Corns and callosities: Secondary | ICD-10-CM

## 2022-03-15 DIAGNOSIS — E1169 Type 2 diabetes mellitus with other specified complication: Secondary | ICD-10-CM

## 2022-03-15 DIAGNOSIS — M25511 Pain in right shoulder: Secondary | ICD-10-CM | POA: Insufficient documentation

## 2022-03-15 DIAGNOSIS — E1142 Type 2 diabetes mellitus with diabetic polyneuropathy: Secondary | ICD-10-CM

## 2022-03-15 DIAGNOSIS — M21372 Foot drop, left foot: Secondary | ICD-10-CM

## 2022-03-17 ENCOUNTER — Encounter: Payer: Self-pay | Admitting: Orthopedic Surgery

## 2022-03-17 ENCOUNTER — Ambulatory Visit (INDEPENDENT_AMBULATORY_CARE_PROVIDER_SITE_OTHER): Payer: 59 | Admitting: Orthopedic Surgery

## 2022-03-17 DIAGNOSIS — M12811 Other specific arthropathies, not elsewhere classified, right shoulder: Secondary | ICD-10-CM

## 2022-03-17 NOTE — Progress Notes (Signed)
Return patient Visit  Assessment: Sabrina Thompson is a 67 y.o. female with the following: Right shoulder rotator cuff arthropathy  Plan: Sabrina Thompson has chronic pain in her right shoulder.  Prior injection provided some relief of her symptoms.  However, she continues to have pain, and limited motion.  We reviewed the MRI in clinic today, which demonstrates a large tear of both the supraspinatus and infraspinatus, with retraction.  She also has atrophy of the supraspinatus and infraspinatus muscle bellies.  As such, I do not think that the rotator cuff tear is repairable.  If she is interested in proceeding with surgery, I would recommend right reverse shoulder arthroplasty.  This was discussed with the patient.  The procedure and recovery was discussed.  All questions were answered.  We will seek medical clearance for surgery.  If she gets medical clearance, we can then move toward scheduling a CT scan, and deciding on a date for surgery.  Follow-up: Return for After medical clearance for OR.  Subjective:  Chief Complaint  Patient presents with   Shoulder Pain    Rt shoulder MRI results.    History of Present Illness: Sabrina Thompson is a 68 y.o. female who returns to clinic today for repeat evaluation of right shoulder.  She has tried physical therapy.  She had an injection which provided some relief of her symptoms.  She continues to have decreased range of motion, and constant pain in her shoulder.  It is progressively worsening.  No recent injuries, but she has had difficulty with the right shoulder for the past 2 years.  She has obtained an MRI, and is here to discuss the results.   Review of Systems: No fevers or chills No numbness or tingling No chest pain No shortness of breath No bowel or bladder dysfunction No GI distress No headaches     Objective: LMP 02/13/2005 (Approximate)   Physical Exam:  General: Alert and oriented. and No acute distress. Gait: Normal  gait.  Right shoulder without deformity.  No atrophy is appreciated.  Forward flexion is limited to 90 degrees.  90 degrees of abduction at her side.  Passively I can get her to 140 degrees before it is too painful.  Positive drop arm test.  Fingers are warm and well-perfused.  2+ radial pulse.  Positive Jobe's.  Positive Hawkins.   IMAGING: I personally ordered and reviewed the following images  Right shoulder MRI  Impression:  1. Complete tear of the supraspinatus and infraspinatus tendons with 5 cm of retraction. 2. Mild tendinosis of the subscapularis tendon. 3. Intra-articular portion and proximal extra-articular portion of the long head of the biceps tendon are not visualized concerning for a complete tear. 4. Severe arthropathy of the acromioclavicular joint.  New Medications:  No orders of the defined types were placed in this encounter.     Mordecai Rasmussen, MD  03/17/2022 11:02 AM

## 2022-03-17 NOTE — Patient Instructions (Signed)
We discussed reverse shoulder arthroplasty in clinic today.  The next step is obtaining clearance from your medical doctor for surgery.  Once you have clearance, we can make plans for a CT scan and select a date for surgery.   You have torn the supraspinatus and infraspinatus tendons in your right shoulder.   These tendons have retracted over 5 cm and cannot be repaired  Please contact the clinic if you have any questions

## 2022-03-22 ENCOUNTER — Other Ambulatory Visit: Payer: 59

## 2022-03-23 ENCOUNTER — Other Ambulatory Visit: Payer: 59

## 2022-03-29 ENCOUNTER — Telehealth: Payer: Self-pay | Admitting: Orthopedic Surgery

## 2022-03-29 NOTE — Telephone Encounter (Signed)
Patient lvm wanting to check on the clearance for her surgery and what the status is of her surgery.  [phone]

## 2022-03-31 ENCOUNTER — Other Ambulatory Visit: Payer: 59

## 2022-03-31 NOTE — Telephone Encounter (Signed)
LVM letting pt know we have received clearance letter from her PCP, awaiting Dr. Amedeo Kinsman feedback on possible dates to get her scheduled.

## 2022-04-05 ENCOUNTER — Other Ambulatory Visit: Payer: 59

## 2022-04-07 ENCOUNTER — Telehealth: Payer: Self-pay | Admitting: Radiology

## 2022-04-07 NOTE — Telephone Encounter (Signed)
Left detailed message saying we do have her approval from PCP, but provider wants to do some research on her A1C and possibly see if we can get it down before surgery. Call back # given as well for questions or concerns.

## 2022-04-07 NOTE — Telephone Encounter (Signed)
Patient called yesterday when the phones were down and LMVM returning a call.  I am unsure who called her, so starting with you.  Please followup, thanks.

## 2022-04-13 ENCOUNTER — Encounter: Payer: Self-pay | Admitting: Radiology

## 2022-05-05 ENCOUNTER — Other Ambulatory Visit: Payer: Self-pay

## 2022-05-05 ENCOUNTER — Telehealth: Payer: Self-pay | Admitting: Orthopedic Surgery

## 2022-05-05 DIAGNOSIS — G8929 Other chronic pain: Secondary | ICD-10-CM

## 2022-05-05 DIAGNOSIS — M12811 Other specific arthropathies, not elsewhere classified, right shoulder: Secondary | ICD-10-CM

## 2022-05-05 NOTE — Telephone Encounter (Signed)
Dr. Amedeo Kinsman patient - pt lvm stating she got a call from radiology that Dr. Amedeo Kinsman just sent in an order for a CT of her shoulder.  She wants to know if she needs to do this that she just had a MRI.  Pt's # [id number]

## 2022-06-01 ENCOUNTER — Ambulatory Visit (HOSPITAL_COMMUNITY)
Admission: RE | Admit: 2022-06-01 | Discharge: 2022-06-01 | Disposition: A | Discharge status: Home / Self Care | Payer: 59 | Source: Ambulatory Visit | Attending: Orthopedic Surgery | Admitting: Orthopedic Surgery

## 2022-06-01 ENCOUNTER — Encounter (HOSPITAL_COMMUNITY): Payer: Self-pay

## 2022-06-01 DIAGNOSIS — M12811 Other specific arthropathies, not elsewhere classified, right shoulder: Secondary | ICD-10-CM | POA: Insufficient documentation

## 2022-06-01 DIAGNOSIS — G8929 Other chronic pain: Secondary | ICD-10-CM | POA: Diagnosis not present

## 2022-06-01 DIAGNOSIS — M25511 Pain in right shoulder: Secondary | ICD-10-CM | POA: Diagnosis not present

## 2022-06-01 DIAGNOSIS — M19011 Primary osteoarthritis, right shoulder: Secondary | ICD-10-CM | POA: Diagnosis not present

## 2022-06-13 ENCOUNTER — Ambulatory Visit (INDEPENDENT_AMBULATORY_CARE_PROVIDER_SITE_OTHER): Payer: 59 | Admitting: Podiatry

## 2022-06-13 DIAGNOSIS — Z91199 Patient's noncompliance with other medical treatment and regimen due to unspecified reason: Secondary | ICD-10-CM

## 2022-06-13 NOTE — Progress Notes (Signed)
No show

## 2022-06-19 ENCOUNTER — Telehealth: Payer: Self-pay | Admitting: Orthopedic Surgery

## 2022-06-19 NOTE — Telephone Encounter (Signed)
Patient returned my call and we discussed surgery date. Patient is fine with keeping 07/03/22 surgery date. She just wanted to make sure Dr. Dallas Schimke was in agreement with this date. I explained to patient the date was discussed and approved by doctor before I contacted her. Patient was satisfied with keeping the 5/20 date. All questions were answered.

## 2022-06-19 NOTE — Telephone Encounter (Signed)
Dr. Dallas Schimke pt - pt lvm stating the hospital called her Friday w/a surgery date of 07/03/22 and she wants to know if this is agreeable with Dr. Dallas Schimke or if it needs to be pushed out.

## 2022-06-19 NOTE — Telephone Encounter (Signed)
I left a message for patient to return my call to further discuss surgery date.

## 2022-06-23 ENCOUNTER — Telehealth: Payer: Self-pay | Admitting: Orthopedic Surgery

## 2022-06-23 DIAGNOSIS — E1165 Type 2 diabetes mellitus with hyperglycemia: Secondary | ICD-10-CM | POA: Diagnosis not present

## 2022-06-23 DIAGNOSIS — I10 Essential (primary) hypertension: Secondary | ICD-10-CM | POA: Diagnosis not present

## 2022-06-23 NOTE — Telephone Encounter (Signed)
Patient called and left voicemail wanting Dr. Dallas Schimke nurse to call her back

## 2022-06-23 NOTE — Telephone Encounter (Signed)
Spoke with pt who states the assistance she was supposed to have for surgery will not be available now. States she has someone who will be able to take her for her surgery, but now she doesn't have someone to be there when she's released the next day. Also, wants to know if she will be able to get home health to assist her since she doesn't have assistance now. Please advise.

## 2022-06-28 ENCOUNTER — Ambulatory Visit: Payer: Self-pay | Admitting: Orthopedic Surgery

## 2022-06-28 DIAGNOSIS — Z01818 Encounter for other preprocedural examination: Secondary | ICD-10-CM

## 2022-06-28 NOTE — Patient Instructions (Addendum)
Sabrina Thompson  06/28/2022     @PREFPERIOPPHARMACY @   Your procedure is scheduled on  07/03/2022.         You need to take 1 shower a day beginning on 06/29/2022 until your surgery ( see enclosed information). This is to help prevent post operative infection.     Put clean sheets on your bed the night of your first shower. (Thursday night). Shower with CHG on Thursday, Friday, Saturday, Sunday nights. Your last shower should be on Monday morning the morning of your surgery.     There is an enclose list of approved lotions that you can use after your shower to help prevent drying of the skin.        DO NOT use any lotions, powders, creams or deodorants the morning of your procedure.     Report to [facility] at  0600 A.M.   Call this number if you have problems the morning of surgery:  [phone]  If you experience any cold or flu symptoms such as cough, fever, chills, shortness of breath, etc. between now and your scheduled surgery, please notify us at the above number.   Remember:  Do not eat after midnight.   You may drink clear liquids until 0330 am on 07/03/2022.                  Clear liquids allowed are:                    Water, Juice (No red color; non-citric and without pulp; diabetics please choose diet or no sugar options), Carbonated beverages (diabetics please choose diet or no sugar options), Clear Tea (No creamer, milk, or cream, including half & half and powdered creamer), Black Coffee Only (No creamer, milk or cream, including half & half and powdered creamer), Plain Jell-O Only (No red color; diabetics please choose no sugar options), Clear Sports drink (No red color; diabetics please choose diet or no sugar options), and Plain Popsicles Only (No red color; diabetics please choose no sugar options)       At 0330 am on 07/03/2022 drink 1- 12 oz bottle of G2 or G zero. You can have nothing else to drink after this.     Take 1/2 of your night time insulin  the night before your procedure.        DO NOT take any medications for diabetes the morning of your procedure.    Take these medicines the morning of surgery with A SIP OF WATER                    flexeril(if needed), prozac, gabapentin.     Do not wear jewelry, make-up or nail polish.  Do not wear lotions, powders, or perfumes, or deodorant.  Do not shave 48 hours prior to surgery.  Men may shave face and neck.  Do not bring valuables to the hospital.  [facility] is not responsible for any belongings or valuables.  Contacts, dentures or bridgework may not be worn into surgery.  Leave your suitcase in the car.  After surgery it may be brought to your room.  For patients admitted to the hospital, discharge time will be determined by your treatment team.  Patients discharged the day of surgery will not be allowed to drive home and must have someone with them for 24 hours.    Special instructions:   DO NOT smoke tobacco or  vape for 24 hours before your procedure.  Please read over the following fact sheets that you were given. Pain Booklet, Coughing and Deep Breathing, Total Joint Packet, Surgical Site Infection Prevention, Anesthesia Post-op Instructions, and Care and Recovery After Surgery      Reverse Total Shoulder Replacement, Care After This sheet gives you information about how to care for yourself after your procedure. Your health care provider may also give you more specific instructions. If you have problems or questions, contact your health care provider. What can I expect after the procedure? After the procedure, it is common to have: Pain in the shoulder and arm. Stiffness in the shoulder and arm. Follow these instructions at home: Medicines Take over-the-counter and prescription medicines only as told by your health care provider. Ask your health care provider if the medicine prescribed to you: Requires you to avoid driving or using machinery. Can cause  constipation. You may need to take these actions to prevent or treat constipation: Drink enough fluid to keep your urine pale yellow. Take over-the-counter or prescription medicines. Eat foods that are high in fiber, such as beans, whole grains, and fresh fruits and vegetables. Limit foods that are high in fat and processed sugars, such as fried or sweet foods. If you have a sling: Wear the sling as told by your health care provider. Remove it only as told by your health care provider. Check the skin around your sling every day. Tell your health care provider about any concerns. Loosen the sling if your fingers tingle, become numb, or turn cold and blue. Keep the sling clean and dry. Bathing Do not take baths, swim, or use a hot tub until your health care provider approves. Ask your health care provider if you may take showers. You may only be allowed to take sponge baths. If the sling is not waterproof: Do not let it get wet. Cover it with a watertight covering when you take a bath or shower. Keep your bandage (dressing) dry until your health care provider says it can be removed. Incision care  Follow instructions from your health care provider about how to take care of your incision. Make sure you: Wash your hands with soap and water for at least 20 seconds before and after you change your dressing. If soap and water are not available, use hand sanitizer. Change your dressing as told by your health care provider. Leave stitches (sutures), skin glue, or adhesive strips in place. These skin closures may need to stay in place for 2 weeks or longer. If adhesive strip edges start to loosen and curl up, you may trim the loose edges. Do not remove adhesive strips completely unless your health care provider tells you to do that. Check your incision area every day for signs of infection. Check for: More redness, swelling, or pain. Fluid or blood. Warmth. Pus or a bad smell. Managing pain,  stiffness, and swelling  If directed, put ice on your shoulder. To do this: If you have a removable sling, remove it as told by your health care provider. Put ice in a plastic bag. Place a towel between your skin and the bag. Leave the ice on for 20 minutes, 2-3 times a day. Remove the ice if your skin turns bright red. This is very important. If you cannot feel pain, heat, or cold, you have a greater risk of damage to the area. Move your fingers and hand often to reduce stiffness and swelling. Driving If you were given  a sedative during the procedure, it can affect you for several hours. Do not drive or operate machinery until your health care provider says that it is safe. Ask your health care provider when it is safe to drive if you have a sling on your arm. Activity Return to your normal activities as told by your health care provider. Ask your health care provider what activities are safe for you. Do shoulder exercises as told by your health care provider. Do not lift your arm above shoulder level until your health care provider approves. Do not make large movements with your arm. Do not push or pull things until your health care provider approves. Do not lift anything that is heavier than 5 lb (2.3 kg) until your health care provider says that it is safe. General instructions Do not use any products that contain nicotine or tobacco, such as cigarettes, e-cigarettes, and chewing tobacco. These can delay healing after surgery. If you need help quitting, ask your health care provider. Tell your health care provider if you plan to have dental work. Also: Tell your dentist about your joint replacement. Ask your health care provider if there are any special instructions you need to follow before having dental care and routine cleanings. Keep all follow-up visits. This is important. Contact a health care provider if: You feel nauseous or you vomit. Your arm tingles or feels numb. Your pain  gets worse, even after taking pain medicine. You have any of these signs of infection: More redness, swelling, or pain around your incision. Fluid or blood coming from your incision. Warmth coming from your incision. Pus or a bad smell coming from your incision. A fever. Get help right away if: Your shoulder joint moves out of place. Your incision comes apart. You have redness, swelling, pain, or warmth in your leg or arm. You have chest pain or shortness of breath. These symptoms may represent a serious problem that is an emergency. Do not wait to see if the symptoms will go away. Get medical help right away. Call your local emergency services (911 in the [place] Do not drive yourself to the hospital. Summary After the procedure, it is common to have some pain and stiffness. Take over-the-counter and prescription medicines only as told by your health care provider. Keep your bandage (dressing) dry until your health care provider says it can be removed. Know the symptoms that should prompt you to contact your health care provider. Do shoulder exercises as told by your health care provider. Ask your health care provider what activities are safe for you. This information is not intended to replace advice given to you by your health care provider. Make sure you discuss any questions you have with your health care provider. Document Revised: 07/16/2019 Document Reviewed: 07/16/2019 Elsevier Patient Education  2023 [employer] General Anesthesia, Adult, Care After The following information offers guidance on how to care for yourself after your procedure. Your health care provider may also give you more specific instructions. If you have problems or questions, contact your health care provider. What can I expect after the procedure? After the procedure, it is common for people to: Have pain or discomfort at the IV site. Have nausea or vomiting. Have a sore throat or hoarseness. Have trouble  concentrating. Feel cold or chills. Feel weak, sleepy, or tired (fatigue). Have soreness and body aches. These can affect parts of the body that were not involved in surgery. Follow these instructions at home: For the time period  you were told by your health care provider:  Rest. Do not participate in activities where you could fall or become injured. Do not drive or use machinery. Do not drink alcohol. Do not take sleeping pills or medicines that cause drowsiness. Do not make important decisions or sign legal documents. Do not take care of children on your own. General instructions Drink enough fluid to keep your urine pale yellow. If you have sleep apnea, surgery and certain medicines can increase your risk for breathing problems. Follow instructions from your health care provider about wearing your sleep device: Anytime you are sleeping, including during daytime naps. While taking prescription pain medicines, sleeping medicines, or medicines that make you drowsy. Return to your normal activities as told by your health care provider. Ask your health care provider what activities are safe for you. Take over-the-counter and prescription medicines only as told by your health care provider. Do not use any products that contain nicotine or tobacco. These products include cigarettes, chewing tobacco, and vaping devices, such as e-cigarettes. These can delay incision healing after surgery. If you need help quitting, ask your health care provider. Contact a health care provider if: You have nausea or vomiting that does not get better with medicine. You vomit every time you eat or drink. You have pain that does not get better with medicine. You cannot urinate or have bloody urine. You develop a skin rash. You have a fever. Get help right away if: You have trouble breathing. You have chest pain. You vomit blood. These symptoms may be an emergency. Get help right away. Call 911. Do not wait  to see if the symptoms will go away. Do not drive yourself to the hospital. Summary After the procedure, it is common to have a sore throat, hoarseness, nausea, vomiting, or to feel weak, sleepy, or fatigue. For the time period you were told by your health care provider, do not drive or use machinery. Get help right away if you have difficulty breathing, have chest pain, or vomit blood. These symptoms may be an emergency. This information is not intended to replace advice given to you by your health care provider. Make sure you discuss any questions you have with your health care provider. Document Revised: 04/29/2021 Document Reviewed: 04/29/2021 Elsevier Patient Education  2023 [employer].

## 2022-06-29 ENCOUNTER — Encounter (HOSPITAL_COMMUNITY): Payer: Self-pay

## 2022-06-29 ENCOUNTER — Encounter (HOSPITAL_COMMUNITY): Payer: Self-pay | Admitting: Anesthesiology

## 2022-06-29 ENCOUNTER — Telehealth: Payer: Self-pay | Admitting: Orthopedic Surgery

## 2022-06-29 ENCOUNTER — Encounter (HOSPITAL_COMMUNITY)
Admission: RE | Admit: 2022-06-29 | Discharge: 2022-06-29 | Disposition: A | Discharge status: Home / Self Care | Payer: 59 | Source: Ambulatory Visit | Attending: Orthopedic Surgery | Admitting: Orthopedic Surgery

## 2022-06-29 VITALS — BP 106/63 | HR 78 | Temp 97.5°F | Resp 18 | Ht 65.0 in | Wt 184.1 lb

## 2022-06-29 DIAGNOSIS — Z862 Personal history of diseases of the blood and blood-forming organs and certain disorders involving the immune mechanism: Secondary | ICD-10-CM | POA: Insufficient documentation

## 2022-06-29 DIAGNOSIS — Z01818 Encounter for other preprocedural examination: Secondary | ICD-10-CM | POA: Insufficient documentation

## 2022-06-29 DIAGNOSIS — I10 Essential (primary) hypertension: Secondary | ICD-10-CM | POA: Diagnosis not present

## 2022-06-29 DIAGNOSIS — R9431 Abnormal electrocardiogram [ECG] [EKG]: Secondary | ICD-10-CM | POA: Diagnosis not present

## 2022-06-29 DIAGNOSIS — E119 Type 2 diabetes mellitus without complications: Secondary | ICD-10-CM | POA: Diagnosis not present

## 2022-06-29 LAB — CBC WITH DIFFERENTIAL/PLATELET
Abs Immature Granulocytes: 0.02 10*3/uL (ref 0.00–0.07)
Basophils Absolute: 0 10*3/uL (ref 0.0–0.1)
Basophils Relative: 1 %
Eosinophils Absolute: 0.1 10*3/uL (ref 0.0–0.5)
Eosinophils Relative: 1 %
HCT: 32.3 % — ABNORMAL LOW (ref 36.0–46.0)
Hemoglobin: 11.1 g/dL — ABNORMAL LOW (ref 12.0–15.0)
Immature Granulocytes: 0 %
Lymphocytes Relative: 31 %
Lymphs Abs: 2.1 10*3/uL (ref 0.7–4.0)
MCH: 29.8 pg (ref 26.0–34.0)
MCHC: 34.4 g/dL (ref 30.0–36.0)
MCV: 86.8 fL (ref 80.0–100.0)
Monocytes Absolute: 0.4 10*3/uL (ref 0.1–1.0)
Monocytes Relative: 6 %
Neutro Abs: 4 10*3/uL (ref 1.7–7.7)
Neutrophils Relative %: 61 %
Platelets: 263 10*3/uL (ref 150–400)
RBC: 3.72 MIL/uL — ABNORMAL LOW (ref 3.87–5.11)
RDW: 12.5 % (ref 11.5–15.5)
WBC: 6.5 10*3/uL (ref 4.0–10.5)
nRBC: 0 % (ref 0.0–0.2)

## 2022-06-29 LAB — BASIC METABOLIC PANEL
Anion gap: 9 (ref 5–15)
BUN: 15 mg/dL (ref 8–23)
CO2: 26 mmol/L (ref 22–32)
Calcium: 9.2 mg/dL (ref 8.9–10.3)
Chloride: 85 mmol/L — ABNORMAL LOW (ref 98–111)
Creatinine, Ser: 0.82 mg/dL (ref 0.44–1.00)
GFR, Estimated: >60 mL/min (ref >60)
Glucose, Bld: 203 mg/dL — ABNORMAL HIGH (ref 70–99)
Potassium: 3.4 mmol/L — ABNORMAL LOW (ref 3.5–5.1)
Sodium: 120 mmol/L — ABNORMAL LOW (ref 135–145)

## 2022-06-29 LAB — HEMOGLOBIN A1C
Hgb A1c MFr Bld: 7.5 % — ABNORMAL HIGH (ref 4.8–5.6)
Mean Plasma Glucose: 168.55 mg/dL

## 2022-06-29 NOTE — Telephone Encounter (Signed)
Called and left detailed message on providers nurse VM. Call back information was left in case of questions or concerns.

## 2022-06-29 NOTE — Telephone Encounter (Signed)
Mrs. Sabrina Thompson was scheduled to undergo a right reverse shoulder arthroplasty on 07/03/2022.  She was seen for her preoperative visit earlier today.  During this visit we obtained routine labs.  Unfortunately, her sodium in particular came back at 120 mmol/L, which is well below the accepted values.  Once these results were available, I discussed them with Dr. Alva Garnet, anesthesiologist.  Based on these values, we will have to postpone surgery.  I have discussed this with the patient, and she states her understanding.  I have advised her to reach out to her primary care doctor to see if there is a cause, and potentially treatment which could improve her values.  Ideally, her sodium should be at least 130 mmol/L for an elective procedure.  Her current value of 120 mmol/L, is too low for consideration, and it is puts her at an increased risk for complications perioperatively.  She states her understanding, and states that she will reach out to her primary care provider. We will also attempt to communicate with her primary care provider, in order to facilitate a follow-up visit and treatment.  If she has any issues, I have asked her to contact the clinic.  As soon as the sodium has returned to an acceptable level, we will be able to get her back on the schedule.    Latronda Spink A. Dallas Schimke, MD MS [facility] [address] [place],  [place]  [address] Phone: [phone] Fax: [phone]

## 2022-07-03 ENCOUNTER — Encounter (HOSPITAL_COMMUNITY): Admission: RE | Payer: Self-pay | Source: Home / Self Care

## 2022-07-03 ENCOUNTER — Ambulatory Visit (HOSPITAL_COMMUNITY): Admission: RE | Admit: 2022-07-03 | Payer: 59 | Source: Home / Self Care | Admitting: Orthopedic Surgery

## 2022-07-03 SURGERY — ARTHROPLASTY, SHOULDER, TOTAL, REVERSE
Anesthesia: Choice | Site: Shoulder | Laterality: Right

## 2022-07-12 ENCOUNTER — Telehealth: Payer: Self-pay | Admitting: Orthopedic Surgery

## 2022-07-12 NOTE — Telephone Encounter (Signed)
Dr. Dallas Schimke pt - pt lvm wanting to know what she needs to do now since her surgery was cancelled, she said "Now what?"  [phone]

## 2022-07-12 NOTE — Telephone Encounter (Signed)
Called and let pt know that we've contacted her PCP to let her know what happened. Pt states she has heard from her PCP and she is scheduled to have labs completed. Advised pt to follow through with care with her PCP until her labs are back WNL, she can contact us at that time so that we can get her back on the schedule for surgery. Pt verbalized understanding.

## 2022-07-13 ENCOUNTER — Other Ambulatory Visit (HOSPITAL_COMMUNITY): Payer: Self-pay | Admitting: Family Medicine

## 2022-07-13 DIAGNOSIS — N3281 Overactive bladder: Secondary | ICD-10-CM | POA: Diagnosis not present

## 2022-07-13 DIAGNOSIS — E782 Mixed hyperlipidemia: Secondary | ICD-10-CM | POA: Diagnosis not present

## 2022-07-13 DIAGNOSIS — E1140 Type 2 diabetes mellitus with diabetic neuropathy, unspecified: Secondary | ICD-10-CM | POA: Diagnosis not present

## 2022-07-13 DIAGNOSIS — L682 Localized hypertrichosis: Secondary | ICD-10-CM | POA: Diagnosis not present

## 2022-07-13 DIAGNOSIS — R2231 Localized swelling, mass and lump, right upper limb: Secondary | ICD-10-CM | POA: Diagnosis not present

## 2022-07-13 DIAGNOSIS — Z0001 Encounter for general adult medical examination with abnormal findings: Secondary | ICD-10-CM | POA: Diagnosis not present

## 2022-07-13 DIAGNOSIS — E1165 Type 2 diabetes mellitus with hyperglycemia: Secondary | ICD-10-CM | POA: Diagnosis not present

## 2022-07-13 DIAGNOSIS — M21372 Foot drop, left foot: Secondary | ICD-10-CM | POA: Diagnosis not present

## 2022-07-13 DIAGNOSIS — Z13820 Encounter for screening for osteoporosis: Secondary | ICD-10-CM

## 2022-07-13 DIAGNOSIS — I10 Essential (primary) hypertension: Secondary | ICD-10-CM | POA: Diagnosis not present

## 2022-07-13 DIAGNOSIS — E871 Hypo-osmolality and hyponatremia: Secondary | ICD-10-CM | POA: Diagnosis not present

## 2022-07-19 ENCOUNTER — Ambulatory Visit (HOSPITAL_COMMUNITY)
Admission: RE | Admit: 2022-07-19 | Discharge: 2022-07-19 | Disposition: A | Discharge status: Home / Self Care | Payer: 59 | Source: Ambulatory Visit | Attending: Family Medicine | Admitting: Family Medicine

## 2022-07-19 DIAGNOSIS — Z794 Long term (current) use of insulin: Secondary | ICD-10-CM | POA: Diagnosis not present

## 2022-07-19 DIAGNOSIS — E119 Type 2 diabetes mellitus without complications: Secondary | ICD-10-CM | POA: Insufficient documentation

## 2022-07-19 DIAGNOSIS — Z13820 Encounter for screening for osteoporosis: Secondary | ICD-10-CM | POA: Diagnosis not present

## 2022-07-19 DIAGNOSIS — Z780 Asymptomatic menopausal state: Secondary | ICD-10-CM | POA: Insufficient documentation

## 2022-07-20 ENCOUNTER — Ambulatory Visit (HOSPITAL_COMMUNITY)
Admission: RE | Admit: 2022-07-20 | Discharge: 2022-07-20 | Disposition: A | Discharge status: Home / Self Care | Payer: 59 | Source: Ambulatory Visit | Attending: Family Medicine | Admitting: Family Medicine

## 2022-07-20 DIAGNOSIS — R600 Localized edema: Secondary | ICD-10-CM | POA: Diagnosis not present

## 2022-07-20 DIAGNOSIS — R2231 Localized swelling, mass and lump, right upper limb: Secondary | ICD-10-CM | POA: Insufficient documentation

## 2022-07-25 ENCOUNTER — Telehealth: Payer: Self-pay | Admitting: Radiology

## 2022-07-25 NOTE — Telephone Encounter (Signed)
Patient called, has seen her PCP, d/c medication that was causing her sodium to lower.  She has had some testing done as well.  What is next step?  Does she need go to PCP?  Clearance request?  See [place] here?  Please call her.

## 2022-07-26 NOTE — Telephone Encounter (Signed)
Gave pt information and she verbalized understanding, will call back after labs are normal.

## 2022-09-12 DIAGNOSIS — E119 Type 2 diabetes mellitus without complications: Secondary | ICD-10-CM | POA: Diagnosis not present

## 2022-09-12 DIAGNOSIS — H2513 Age-related nuclear cataract, bilateral: Secondary | ICD-10-CM | POA: Diagnosis not present

## 2022-09-12 DIAGNOSIS — Z7984 Long term (current) use of oral hypoglycemic drugs: Secondary | ICD-10-CM | POA: Diagnosis not present

## 2022-09-12 DIAGNOSIS — Z794 Long term (current) use of insulin: Secondary | ICD-10-CM | POA: Diagnosis not present

## 2022-09-12 DIAGNOSIS — H43811 Vitreous degeneration, right eye: Secondary | ICD-10-CM | POA: Diagnosis not present

## 2022-09-27 DIAGNOSIS — I10 Essential (primary) hypertension: Secondary | ICD-10-CM | POA: Diagnosis not present

## 2022-09-27 DIAGNOSIS — E1165 Type 2 diabetes mellitus with hyperglycemia: Secondary | ICD-10-CM | POA: Diagnosis not present

## 2022-09-27 DIAGNOSIS — E871 Hypo-osmolality and hyponatremia: Secondary | ICD-10-CM | POA: Diagnosis not present

## 2022-10-26 DIAGNOSIS — H01001 Unspecified blepharitis right upper eyelid: Secondary | ICD-10-CM | POA: Diagnosis not present

## 2022-10-26 DIAGNOSIS — H01002 Unspecified blepharitis right lower eyelid: Secondary | ICD-10-CM | POA: Diagnosis not present

## 2022-10-26 DIAGNOSIS — H2513 Age-related nuclear cataract, bilateral: Secondary | ICD-10-CM | POA: Diagnosis not present

## 2022-10-26 DIAGNOSIS — H01004 Unspecified blepharitis left upper eyelid: Secondary | ICD-10-CM | POA: Diagnosis not present

## 2022-10-30 ENCOUNTER — Encounter: Payer: Self-pay | Admitting: Podiatry

## 2022-10-30 ENCOUNTER — Ambulatory Visit (INDEPENDENT_AMBULATORY_CARE_PROVIDER_SITE_OTHER): Payer: 59 | Admitting: Podiatry

## 2022-10-30 DIAGNOSIS — L84 Corns and callosities: Secondary | ICD-10-CM | POA: Diagnosis not present

## 2022-10-30 DIAGNOSIS — B351 Tinea unguium: Secondary | ICD-10-CM | POA: Diagnosis not present

## 2022-10-30 DIAGNOSIS — E1142 Type 2 diabetes mellitus with diabetic polyneuropathy: Secondary | ICD-10-CM | POA: Diagnosis not present

## 2022-10-30 DIAGNOSIS — M79674 Pain in right toe(s): Secondary | ICD-10-CM | POA: Diagnosis not present

## 2022-10-30 DIAGNOSIS — M79675 Pain in left toe(s): Secondary | ICD-10-CM | POA: Diagnosis not present

## 2022-10-30 NOTE — Progress Notes (Signed)
Subjective:  Patient ID: Sabrina Thompson, female    DOB: Feb 20, 1955,  MRN: [id number]  Chief Complaint  Patient presents with   Nail Problem    Pt presents today for rfc.    67 y.o. female presents oday for toenail trim with concern of thickened elongated and painful nails that are difficult to trim. Requesting to have them trimmed today. Relates burning and tingling in their feet. Patient is diabetic and last A1c was  Lab Results  Component Value Date   HGBA1C 7.5 (H) 06/29/2022   .   PCP:  Leone Payor, FNP   Denies any other pedal complaints.   Objective:  Physical Exam: warm, good capillary refill, nail exam normal nails without lesions, no trophic changes or ulcerative lesions. DP pulses palpable, PT pulses palpable, and protective sensation absent Left Foot: hammertoe deformities noted; dropfoot left. 2nd toe rigid deformity, hallux IPJ exostosis noted. 3rd-5th toes reducible. Right Foot: hammertoe deformities noted Nails 1-5 b/l are thickened and elongated with subungual debris. Hyperkeratotic lesion noted sub first metatarsal on left.   No images are attached to the encounter.  Assessment:   1. Pain due to onychomycosis of toenails of both feet   2. DM type 2 with diabetic peripheral neuropathy (HCC)       Plan:  Patient was evaluated and treated and all questions answered.  -Discussed and educated patient on diabetic foot care, especially with  regards to the vascular, neurological and musculoskeletal systems.  -Stressed the importance of good glycemic control and the detriment of not  controlling glucose levels in relation to the foot. -Discussed supportive shoes at all times and checking feet regularly.  -Mechanically debrided all nails 1-5 bilateral using sterile nail nipper and filed with dremel without incident  -Hyperkeratotic tissue debrided without incident with chisel.  -Answered all patient questions -Patient to return  in 3 months for at risk foot  care -Patient advised to call the office if any problems or questions arise in the meantime.      No follow-ups on file.

## 2022-11-01 ENCOUNTER — Other Ambulatory Visit: Payer: 59

## 2022-11-01 NOTE — H&P (Signed)
Surgical History & Physical  Patient Name: Sabrina Thompson  DOB: Jun 20, 1955  Surgery: Cataract extraction with intraocular lens implant phacoemulsification; Right Eye Surgeon: Fabio Pierce MD Surgery Date: 11/06/2022 Pre-Op Date: 10/26/2022  HPI: A 81 Yr. old female patient presents for a Cat Eval referred by Dr. Daphine Deutscher. Patient states vision is very blurry and hazy. Patient states she used to love reading but she cannot read at all anymore because she cannot see well. Patient c/o of glare when it is sunny outside. She states it is difficult to read small print and read labels on medicine bottles. This is negatively affecting the patient's quality of life and the patient is unable to function adequately in life with the current level of vision. Patient denies flashes. Patient states she did have a spider like floater but addressed this with Dr. Daphine Deutscher and was told it is due to the jelly detaching in the back of the eye and it is normal. Patient not using any gtts. Patient is type 2 DM Last A1c: 7.9 3 weeks ago FBS: 61 this morning HPI Completed by Dr. Fabio Pierce  Medical History: Cataracts  Arthritis Diabetes High Blood Pressure LDL  Review of Systems Cardiovascular High Blood Pressure Endocrine diabetic Musculoskeletal arthritis All recorded systems are negative except as noted above.  Social Former smoker   Medication Atorvastatin, Gemtesa, Glipizide, Olmesartan medoxomil, Trazodone, Cyclobenzaprine, Fluoxetine, Humalog KwikPen Insulin, Toujeo Max U-300 SoloStar, Tylenol Arthritis Pain, Gabapentin  Sx/Procedures D & C, Tubal Ligation, Hernia Sx, Gallbladder, Spinal surgery  Drug Allergies  Prednisone, Flonase Allergy Relief   History & Physical: Heent: cataracts NECK: supple without bruits LUNGS: lungs clear to auscultation CV: regular rate and rhythm Abdomen: soft and non-tender  Impression & Plan: Assessment: 1.  NUCLEAR SCLEROSIS AGE RELATED; Both Eyes (H25.13) 2.   BLEPHARITIS; Right Upper Lid, Right Lower Lid, Left Upper Lid, Left Lower Lid (H01.001, H01.002,H01.004,H01.005) 3.  DERMATOCHALASIS, no surgery; Right Upper Lid, Left Upper Lid (H02.831, Z61.096)  Plan: 1.  Cataract accounts for the patient's decreased vision. This visual impairment is not correctable with a tolerable change in glasses or contact lenses. Cataract surgery with an implantation of a new lens should significantly improve the visual and functional status of the patient. Discussed all risks, benefits, alternatives, and potential complications. Discussed the procedures and recovery. Patient desires to have surgery. A-scan ordered and performed today for intra-ocular lens calculations. The surgery will be performed in order to improve vision for driving, reading, and for eye examinations. Recommend phacoemulsification with intra-ocular lens. Recommend Dextenza for post-operative pain and inflammation. Right Eye worse - first. Dilates well - shugarcaine by protocol.  2.  Blepharitis is present - recommend regular lid cleaning.  3.  Likely symptomatic. Will address after cataract surgery.

## 2022-11-02 ENCOUNTER — Encounter (HOSPITAL_COMMUNITY)
Admission: RE | Admit: 2022-11-02 | Discharge: 2022-11-02 | Disposition: A | Discharge status: Home / Self Care | Payer: 59 | Source: Ambulatory Visit | Attending: Ophthalmology | Admitting: Ophthalmology

## 2022-11-02 DIAGNOSIS — M21372 Foot drop, left foot: Secondary | ICD-10-CM | POA: Diagnosis not present

## 2022-11-02 DIAGNOSIS — E782 Mixed hyperlipidemia: Secondary | ICD-10-CM | POA: Diagnosis not present

## 2022-11-02 DIAGNOSIS — D649 Anemia, unspecified: Secondary | ICD-10-CM | POA: Diagnosis not present

## 2022-11-02 DIAGNOSIS — E1165 Type 2 diabetes mellitus with hyperglycemia: Secondary | ICD-10-CM | POA: Diagnosis not present

## 2022-11-02 DIAGNOSIS — I10 Essential (primary) hypertension: Secondary | ICD-10-CM | POA: Diagnosis not present

## 2022-11-02 DIAGNOSIS — G629 Polyneuropathy, unspecified: Secondary | ICD-10-CM | POA: Diagnosis not present

## 2022-11-02 DIAGNOSIS — L682 Localized hypertrichosis: Secondary | ICD-10-CM | POA: Diagnosis not present

## 2022-11-02 DIAGNOSIS — N3281 Overactive bladder: Secondary | ICD-10-CM | POA: Diagnosis not present

## 2022-11-02 DIAGNOSIS — E871 Hypo-osmolality and hyponatremia: Secondary | ICD-10-CM | POA: Diagnosis not present

## 2022-11-02 NOTE — Pre-Procedure Instructions (Signed)
Attempted pre-op phone call. Left Vm for her to call us back.

## 2022-11-06 ENCOUNTER — Ambulatory Visit (HOSPITAL_COMMUNITY): Admission: RE | Admit: 2022-11-06 | Payer: 59 | Source: Home / Self Care | Admitting: Ophthalmology

## 2022-11-06 ENCOUNTER — Encounter (HOSPITAL_COMMUNITY): Admission: RE | Payer: Self-pay | Source: Home / Self Care

## 2022-11-06 SURGERY — CATARACT EXTRACTION PHACO AND INTRAOCULAR LENS PLACEMENT (IOC) with placement of Corticosteroid
Anesthesia: Monitor Anesthesia Care | Laterality: Right

## 2022-11-17 DIAGNOSIS — E1165 Type 2 diabetes mellitus with hyperglycemia: Secondary | ICD-10-CM | POA: Diagnosis not present

## 2022-11-20 DIAGNOSIS — H2511 Age-related nuclear cataract, right eye: Secondary | ICD-10-CM | POA: Diagnosis not present

## 2022-11-21 NOTE — H&P (Signed)
Surgical History & Physical  Patient Name: Sabrina Thompson  DOB: August 05, 1955  Surgery: Cataract extraction with intraocular lens implant phacoemulsification; Right Eye Surgeon: Fabio Pierce MD Surgery Date: 11/27/2022 Pre-Op Date: 10/26/2022  HPI: A 59 Yr. old female patient presents for a Cat Eval referred by Dr. Daphine Deutscher. Patient states vision is very blurry and hazy. Patient states she used to love reading but she cannot read at all anymore because she cannot see well. Patient c/o of glare when it is sunny outside. She states it is difficult to read small print and read labels on medicine bottles. This is negatively affecting the patient's quality of life and the patient is unable to function adequately in life with the current level of vision. Patient denies flashes. Patient states she did have a spider like floater but addressed this with Dr. Daphine Deutscher and was told it is due to the jelly detaching in the back of the eye and it is normal. Patient not using any gtts. Patient is type 2 DM Last A1c: 7.9 3 weeks ago FBS: 61 this morning HPI Completed by Dr. Fabio Pierce  Medical History: Cataracts  Arthritis Diabetes High Blood Pressure LDL  Review of Systems Cardiovascular High Blood Pressure Endocrine diabetic Musculoskeletal arthritis All recorded systems are negative except as noted above.  Social Former smoker  Alcohol Never  Medication Atorvastatin, Gemtesa, Glipizide, Olmesartan medoxomil, Trazodone, Cyclobenzaprine, Fluoxetine, Humalog KwikPen Insulin, Toujeo Max U-300 SoloStar, Tylenol Arthritis Pain, Gabapentin  Sx/Procedures D & C, Tubal Ligation, Hernia Sx, Gallbladder, Spinal surgery  Drug Allergies  Prednisone, Flonase Allergy Relief  History & Physical: Heent: cataracts NECK: supple without bruits LUNGS: lungs clear to auscultation CV: regular rate and rhythm Abdomen: soft and non-tender  Impression & Plan: Assessment: 1.  NUCLEAR SCLEROSIS AGE RELATED; Both  Eyes (H25.13) 2.  BLEPHARITIS; Right Upper Lid, Right Lower Lid, Left Upper Lid, Left Lower Lid (H01.001, H01.002,H01.004,H01.005) 3.  DERMATOCHALASIS, no surgery; Right Upper Lid, Left Upper Lid (H02.831, W29.562)  Plan: 1.  Cataract accounts for the patient's decreased vision. This visual impairment is not correctable with a tolerable change in glasses or contact lenses. Cataract surgery with an implantation of a new lens should significantly improve the visual and functional status of the patient. Discussed all risks, benefits, alternatives, and potential complications. Discussed the procedures and recovery. Patient desires to have surgery. A-scan ordered and performed today for intra-ocular lens calculations. The surgery will be performed in order to improve vision for driving, reading, and for eye examinations. Recommend phacoemulsification with intra-ocular lens. Recommend Dextenza for post-operative pain and inflammation. Right Eye worse - first. Dilates well - shugarcaine by protocol.  2.  Blepharitis is present - recommend regular lid cleaning.  3.  Likely symptomatic. Will address after cataract surgery.

## 2022-11-22 ENCOUNTER — Encounter (HOSPITAL_COMMUNITY): Payer: Self-pay

## 2022-11-22 ENCOUNTER — Encounter (HOSPITAL_COMMUNITY)
Admission: RE | Admit: 2022-11-22 | Discharge: 2022-11-22 | Disposition: A | Discharge status: Home / Self Care | Payer: 59 | Source: Ambulatory Visit | Attending: Ophthalmology | Admitting: Ophthalmology

## 2022-11-27 ENCOUNTER — Encounter (HOSPITAL_COMMUNITY)
Admission: RE | Disposition: A | Discharge status: Home / Self Care | Payer: Self-pay | Source: Home / Self Care | Attending: Ophthalmology

## 2022-11-27 ENCOUNTER — Encounter (HOSPITAL_COMMUNITY): Payer: Self-pay | Admitting: Ophthalmology

## 2022-11-27 ENCOUNTER — Ambulatory Visit (HOSPITAL_COMMUNITY)
Admission: RE | Admit: 2022-11-27 | Discharge: 2022-11-27 | Disposition: A | Discharge status: Home / Self Care | Payer: 59 | Attending: Ophthalmology | Admitting: Ophthalmology

## 2022-11-27 ENCOUNTER — Ambulatory Visit (HOSPITAL_COMMUNITY): Payer: 59 | Admitting: Anesthesiology

## 2022-11-27 ENCOUNTER — Other Ambulatory Visit: Payer: Self-pay

## 2022-11-27 DIAGNOSIS — H2511 Age-related nuclear cataract, right eye: Secondary | ICD-10-CM | POA: Diagnosis not present

## 2022-11-27 DIAGNOSIS — Z794 Long term (current) use of insulin: Secondary | ICD-10-CM | POA: Diagnosis not present

## 2022-11-27 DIAGNOSIS — H0100A Unspecified blepharitis right eye, upper and lower eyelids: Secondary | ICD-10-CM | POA: Insufficient documentation

## 2022-11-27 DIAGNOSIS — E1136 Type 2 diabetes mellitus with diabetic cataract: Secondary | ICD-10-CM | POA: Insufficient documentation

## 2022-11-27 DIAGNOSIS — I10 Essential (primary) hypertension: Secondary | ICD-10-CM | POA: Diagnosis not present

## 2022-11-27 DIAGNOSIS — Z87891 Personal history of nicotine dependence: Secondary | ICD-10-CM | POA: Insufficient documentation

## 2022-11-27 DIAGNOSIS — H5711 Ocular pain, right eye: Secondary | ICD-10-CM | POA: Insufficient documentation

## 2022-11-27 DIAGNOSIS — H0100B Unspecified blepharitis left eye, upper and lower eyelids: Secondary | ICD-10-CM | POA: Diagnosis not present

## 2022-11-27 LAB — GLUCOSE, CAPILLARY: Glucose-Capillary: 115 mg/dL — ABNORMAL HIGH (ref 70–99)

## 2022-11-27 SURGERY — CATARACT EXTRACTION PHACO AND INTRAOCULAR LENS PLACEMENT (IOC) with placement of Corticosteroid
Anesthesia: Monitor Anesthesia Care | Site: Eye | Laterality: Right

## 2022-11-27 MED ORDER — PHENYLEPHRINE HCL 2.5 % OP SOLN
1.0000 [drp] | OPHTHALMIC | Status: AC | PRN
  Administered 2022-11-27 (×3): 1 [drp] via OPHTHALMIC

## 2022-11-27 MED ORDER — EPINEPHRINE PF 1 MG/ML IJ SOLN
INTRAMUSCULAR | Status: AC
  Filled 2022-11-27: qty 1

## 2022-11-27 MED ORDER — MIDAZOLAM HCL 2 MG/2ML IJ SOLN
INTRAMUSCULAR | Status: DC | PRN
  Administered 2022-11-27: 2 mg via INTRAVENOUS

## 2022-11-27 MED ORDER — BSS IO SOLN
INTRAOCULAR | Status: DC | PRN
  Administered 2022-11-27: 15 mL via INTRAOCULAR

## 2022-11-27 MED ORDER — EPINEPHRINE PF 1 MG/ML IJ SOLN
INTRAOCULAR | Status: DC | PRN
  Administered 2022-11-27: 500 mL

## 2022-11-27 MED ORDER — SODIUM HYALURONATE 23MG/ML IO SOSY
PREFILLED_SYRINGE | INTRAOCULAR | Status: DC | PRN
  Administered 2022-11-27: .6 mL via INTRAOCULAR

## 2022-11-27 MED ORDER — SODIUM CHLORIDE 0.9% FLUSH
INTRAVENOUS | Status: DC | PRN
  Administered 2022-11-27: 3 mL via INTRAVENOUS

## 2022-11-27 MED ORDER — DEXAMETHASONE 0.4 MG OP INST
VAGINAL_INSERT | OPHTHALMIC | Status: DC | PRN
  Administered 2022-11-27: .4 mg via OPHTHALMIC

## 2022-11-27 MED ORDER — TROPICAMIDE 1 % OP SOLN
1.0000 [drp] | OPHTHALMIC | Status: AC | PRN
  Administered 2022-11-27 (×3): 1 [drp] via OPHTHALMIC

## 2022-11-27 MED ORDER — STERILE WATER FOR IRRIGATION IR SOLN
Status: DC | PRN
  Administered 2022-11-27: 250 mL

## 2022-11-27 MED ORDER — SODIUM HYALURONATE 10 MG/ML IO SOLUTION
PREFILLED_SYRINGE | INTRAOCULAR | Status: DC | PRN
  Administered 2022-11-27: .85 mL via INTRAOCULAR

## 2022-11-27 MED ORDER — MOXIFLOXACIN HCL 5 MG/ML IO SOLN
INTRAOCULAR | Status: DC | PRN
  Administered 2022-11-27: .2 mL via INTRACAMERAL

## 2022-11-27 MED ORDER — LIDOCAINE HCL 3.5 % OP GEL
1.0000 | Freq: Once | OPHTHALMIC | Status: AC
  Administered 2022-11-27: 1 via OPHTHALMIC

## 2022-11-27 MED ORDER — MIDAZOLAM HCL 2 MG/2ML IJ SOLN
INTRAMUSCULAR | Status: AC
  Filled 2022-11-27: qty 2

## 2022-11-27 MED ORDER — LIDOCAINE HCL (PF) 1 % IJ SOLN
INTRAOCULAR | Status: DC | PRN
  Administered 2022-11-27: 1 mL via OPHTHALMIC

## 2022-11-27 MED ORDER — MOXIFLOXACIN HCL 5 MG/ML IO SOLN
INTRAOCULAR | Status: AC
  Filled 2022-11-27: qty 1

## 2022-11-27 MED ORDER — POVIDONE-IODINE 5 % OP SOLN
OPHTHALMIC | Status: DC | PRN
  Administered 2022-11-27: 1 via OPHTHALMIC

## 2022-11-27 MED ORDER — TETRACAINE HCL 0.5 % OP SOLN
1.0000 [drp] | OPHTHALMIC | Status: AC | PRN
  Administered 2022-11-27 (×3): 1 [drp] via OPHTHALMIC

## 2022-11-27 MED ORDER — DEXAMETHASONE 0.4 MG OP INST
VAGINAL_INSERT | OPHTHALMIC | Status: AC
  Filled 2022-11-27: qty 1

## 2022-11-27 SURGICAL SUPPLY — 15 items
CATARACT SUITE SIGHTPATH (MISCELLANEOUS) ×1
CLOTH BEACON ORANGE TIMEOUT ST (SAFETY) ×1 IMPLANT
EYE SHIELD UNIVERSAL CLEAR (GAUZE/BANDAGES/DRESSINGS) IMPLANT
FEE CATARACT SUITE SIGHTPATH (MISCELLANEOUS) ×1 IMPLANT
GLOVE BIOGEL PI IND STRL 7.0 (GLOVE) ×2 IMPLANT
LENS IOL TECNIS EYHANCE 14.5 (Intraocular Lens) IMPLANT
NDL HYPO 18GX1.5 BLUNT FILL (NEEDLE) ×1 IMPLANT
NEEDLE HYPO 18GX1.5 BLUNT FILL (NEEDLE) ×1
PAD ARMBOARD 7.5X6 YLW CONV (MISCELLANEOUS) ×1 IMPLANT
POSITIONER HEAD 8X9X4 ADT (SOFTGOODS) ×1 IMPLANT
RING MALYGIN (MISCELLANEOUS) IMPLANT
RING MALYGIN 7.0 (MISCELLANEOUS) IMPLANT
SYR TB 1ML LL NO SAFETY (SYRINGE) ×1 IMPLANT
TAPE SURG TRANSPORE 1 IN (GAUZE/BANDAGES/DRESSINGS) IMPLANT
WATER STERILE IRR 250ML POUR (IV SOLUTION) ×1 IMPLANT

## 2022-11-27 NOTE — Anesthesia Preprocedure Evaluation (Signed)
Anesthesia Evaluation  Patient identified by MRN, date of birth, ID band Patient awake    Reviewed: Allergy & Precautions, H&P , NPO status , Patient's Chart, lab work & pertinent test results, reviewed documented beta blocker date and time   Airway Mallampati: II  TM Distance: >3 FB Neck ROM: full    Dental no notable dental hx.    Pulmonary neg pulmonary ROS, former smoker   Pulmonary exam normal breath sounds clear to auscultation       Cardiovascular Exercise Tolerance: Good hypertension, negative cardio ROS  Rhythm:regular Rate:Normal     Neuro/Psych  PSYCHIATRIC DISORDERS Anxiety Depression     Neuromuscular disease negative neurological ROS  negative psych ROS   GI/Hepatic negative GI ROS, Neg liver ROS,,,  Endo/Other  negative endocrine ROSdiabetes    Renal/GU negative Renal ROS  negative genitourinary   Musculoskeletal   Abdominal   Peds  Hematology negative hematology ROS (+) Blood dyscrasia, anemia   Anesthesia Other Findings   Reproductive/Obstetrics negative OB ROS                             Anesthesia Physical Anesthesia Plan  ASA: 2  Anesthesia Plan: General and MAC   Post-op Pain Management:    Induction:   PONV Risk Score and Plan:   Airway Management Planned:   Additional Equipment:   Intra-op Plan:   Post-operative Plan:   Informed Consent: I have reviewed the patients History and Physical, chart, labs and discussed the procedure including the risks, benefits and alternatives for the proposed anesthesia with the patient or authorized representative who has indicated his/her understanding and acceptance.     Dental Advisory Given  Plan Discussed with: CRNA  Anesthesia Plan Comments:        Anesthesia Quick Evaluation

## 2022-11-27 NOTE — Op Note (Signed)
Date of procedure: 11/27/22  Pre-operative diagnosis:  Visually significant nculear age-related cataract, Right Eye (H25.11)  Post-operative diagnosis:   1. Visually significant nuclear age-related cataract, Right Eye (H25.11) 2. Pain and inflammation following cataract surgery Right Eye (H57.11)  Procedure:  Removal of cataract via phacoemulsification and insertion of intra-ocular lens [employer] DIB00 +14.5D into the capsular bag of the Right Eye 2. Placement of Dextenza insert, Right Eye  Attending surgeon: Rudy Jew. Rigoberto Repass, MD, MA  Anesthesia: MAC, Topical Akten  Complications: None  Estimated Blood Loss: <18mL (minimal)  Specimens: None  Implants: As above  Indications:  Visually significant age-related cataract, Right Eye  Procedure:  The patient was seen and identified in the pre-operative area. The operative eye was identified and dilated.  The operative eye was marked.  Topical anesthesia was administered to the operative eye.     The patient was then to the operative suite and placed in the supine position.  A timeout was performed confirming the patient, procedure to be performed, and all other relevant information.   The patient's face was prepped and draped in the usual fashion for intra-ocular surgery.  A lid speculum was placed into the operative eye and the surgical microscope moved into place and focused.  A superotemporal paracentesis was created using a 20 gauge paracentesis blade.  Shugarcaine was injected into the anterior chamber.  Viscoelastic was injected into the anterior chamber.  A temporal clear-corneal main wound incision was created using a 2.45mm microkeratome.  A continuous curvilinear capsulorrhexis was initiated using an irrigating cystitome and completed using capsulorrhexis forceps.  Hydrodissection and hydrodeliniation were performed.  Viscoelastic was injected into the anterior chamber.  A phacoemulsification handpiece and a chopper as a  second instrument were used to remove the nucleus and epinucleus. The irrigation/aspiration handpiece was used to remove any remaining cortical material.   The capsular bag was reinflated with viscoelastic, checked, and found to be intact.  The intraocular lens was inserted into the capsular bag.  The irrigation/aspiration handpiece was used to remove any remaining viscoelastic.  The clear corneal wound and paracentesis wounds were then hydrated and checked with Weck-Cels to be watertight. 0.66mL of moxifloxacin was injected into the anterior chamber.  The lid-speculum was removed. The lower punctum was dilated. A Dextenza implant was placed in the lower canaliculus without complication.  The drape was removed.  The patient's face was cleaned with a wet and dry 4x4. A clear shield was taped over the eye. The patient was taken to the post-operative care unit in good condition, having tolerated the procedure well.  Post-Op Instructions: The patient will follow up at [facility] for a same day post-operative evaluation and will receive all other orders and instructions.

## 2022-11-27 NOTE — Transfer of Care (Signed)
Immediate Anesthesia Transfer of Care Note  Patient: Sabrina Thompson  Procedure(s) Performed: CATARACT EXTRACTION PHACO AND INTRAOCULAR LENS PLACEMENT (IOC) with placement of Corticosteroid (Right: Eye)  Patient Location: Short Stay  Anesthesia Type:MAC  Level of Consciousness: awake, alert , oriented, and patient cooperative  Airway & Oxygen Therapy: Patient Spontanous Breathing  Post-op Assessment: Report given to RN, Post -op Vital signs reviewed and stable, and Patient moving all extremities X 4  Post vital signs: Reviewed and stable  Last Vitals:  Vitals Value Taken Time  BP 130/72 11/27/22 0816  Temp 36.6 C 11/27/22 0816  Pulse 75 11/27/22 0816  Resp 17 11/27/22 0816  SpO2 99 % 11/27/22 0816    Last Pain:  Vitals:   11/27/22 0816  TempSrc: Oral  PainSc: 0-No pain      Patients Stated Pain Goal: 6 (11/27/22 0650)  Complications: No notable events documented.

## 2022-11-27 NOTE — Discharge Instructions (Addendum)
Please discharge patient when stable, will follow up today with Dr. Carnella Fryman at the [facility] office immediately following discharge.  Leave shield in place until visit.  All paperwork with discharge instructions will be given at the office.  [facility] Address:  [address]  [place], [place] [address]  

## 2022-11-27 NOTE — Interval H&P Note (Signed)
History and Physical Interval Note:  11/27/2022 7:46 AM  Sabrina Thompson  has presented today for surgery, with the diagnosis of age related nuclear cataract, right eye.  The various methods of treatment have been discussed with the patient and family. After consideration of risks, benefits and other options for treatment, the patient has consented to  Procedure(s) with comments: CATARACT EXTRACTION PHACO AND INTRAOCULAR LENS PLACEMENT (IOC) with placement of Corticosteroid (Right) - CDE: as a surgical intervention.  The patient's history has been reviewed, patient examined, no change in status, stable for surgery.  I have reviewed the patient's chart and labs.  Questions were answered to the patient's satisfaction.     Fabio Pierce

## 2022-12-01 ENCOUNTER — Encounter (HOSPITAL_COMMUNITY): Payer: Self-pay | Admitting: Ophthalmology

## 2022-12-01 NOTE — Anesthesia Postprocedure Evaluation (Signed)
Anesthesia Post Note  Patient: Sabrina Thompson  Procedure(s) Performed: CATARACT EXTRACTION PHACO AND INTRAOCULAR LENS PLACEMENT (IOC) with placement of Corticosteroid (Right: Eye)  Patient location during evaluation: Phase II Anesthesia Type: MAC Level of consciousness: awake Pain management: pain level controlled Vital Signs Assessment: post-procedure vital signs reviewed and stable Respiratory status: spontaneous breathing and respiratory function stable Cardiovascular status: blood pressure returned to baseline and stable Postop Assessment: no headache and no apparent nausea or vomiting Anesthetic complications: no Comments: Late entry   No notable events documented.   Last Vitals:  Vitals:   11/27/22 0650 11/27/22 0816  BP: 136/69 130/72  Pulse: 77 75  Resp: 16 17  Temp: 37 C 36.6 C  SpO2: 100% 99%    Last Pain:  Vitals:   11/27/22 0816  TempSrc: Oral  PainSc: 0-No pain                 Windell Norfolk

## 2022-12-15 DIAGNOSIS — Z23 Encounter for immunization: Secondary | ICD-10-CM | POA: Diagnosis not present

## 2022-12-18 DIAGNOSIS — L988 Other specified disorders of the skin and subcutaneous tissue: Secondary | ICD-10-CM | POA: Diagnosis not present

## 2022-12-18 DIAGNOSIS — R234 Changes in skin texture: Secondary | ICD-10-CM | POA: Diagnosis not present

## 2022-12-18 DIAGNOSIS — E871 Hypo-osmolality and hyponatremia: Secondary | ICD-10-CM | POA: Diagnosis not present

## 2022-12-18 DIAGNOSIS — H25812 Combined forms of age-related cataract, left eye: Secondary | ICD-10-CM | POA: Diagnosis not present

## 2022-12-18 DIAGNOSIS — M25561 Pain in right knee: Secondary | ICD-10-CM | POA: Diagnosis not present

## 2022-12-18 DIAGNOSIS — R0600 Dyspnea, unspecified: Secondary | ICD-10-CM | POA: Diagnosis not present

## 2022-12-18 DIAGNOSIS — E1165 Type 2 diabetes mellitus with hyperglycemia: Secondary | ICD-10-CM | POA: Diagnosis not present

## 2022-12-21 ENCOUNTER — Other Ambulatory Visit: Payer: Self-pay

## 2022-12-21 ENCOUNTER — Encounter (HOSPITAL_COMMUNITY)
Admission: RE | Admit: 2022-12-21 | Discharge: 2022-12-21 | Disposition: A | Discharge status: Home / Self Care | Payer: 59 | Source: Ambulatory Visit | Attending: Ophthalmology | Admitting: Ophthalmology

## 2022-12-21 ENCOUNTER — Encounter (HOSPITAL_COMMUNITY): Payer: Self-pay

## 2022-12-21 NOTE — Pre-Procedure Instructions (Signed)
Attempted pre-op phonecall. Left VM for her to call us back. 

## 2022-12-22 NOTE — H&P (Signed)
Surgical History & Physical  Patient Name: Sabrina Thompson  DOB: November 21, 1955  Surgery: Cataract extraction with intraocular lens implant phacoemulsification; Left Eye Surgeon: Fabio Pierce MD Surgery Date: 12/25/2022 Pre-Op Date: 12/04/2022  HPI: A 24 Yr. old female patient 1. The patient is returning for a cataract follow-up of the right eye. Since the last visit, the affected area is doing well. The patient's vision is improved. The condition's severity is constant. Patient is following medication instructions. Patient also here for blurry vision in the left eye. Patient has trouble with glare at night. There is a large difference between the two eyes. This is negatively affecting the patient's quality of life and the patient is unable to function adequately in life with the current level of vision. Pt. is ready to proceed with cataract surgery on OS, due to blurred vision. HPI Completed by Dr. Fabio Pierce  Medical History: Cataracts  Arthritis Diabetes High Blood Pressure LDL  Review of Systems Negative Allergic/Immunologic Hypertension Cardiovascular Negative Constitutional Negative Ear, Nose, Mouth & Throat Diabetes Endocrine Negative Eyes Negative Gastrointestinal Negative Genitourinary Negative Hemotologic/Lymphatic Negative Integumentary Arthritis Musculoskeletal Negative Neurological Negative Psychiatry Negative Respiratory  Social Former smoker   Medication Moxifloxacin, Ilevro, Prednisolone acetate,  Atorvastatin, Gemtesa, Glipizide, Olmesartan medoxomil, Trazodone, Cyclobenzaprine, Fluoxetine, Humalog KwikPen Insulin, Toujeo Max U-300 SoloStar, Tylenol Arthritis Pain, Gabapentin  Sx/Procedures Phaco c IOL OD with Dextenza,  D & C, Tubal Ligation, Hernia Sx, Gallbladder, Spinal surgery   Drug Allergies  prednisone ,  Flonase Allergy Relief   History & Physical: Heent: cataract  NECK: supple without bruits LUNGS: lungs clear to auscultation CV: regular  rate and rhythm Abdomen: soft and non-tender  Impression & Plan: Assessment: 1.  CATARACT EXTRACTION STATUS; Right Eye (Z98.41) 2.  NUCLEAR SCLEROSIS AGE RELATED; , Left Eye (H25.12) 3.  INTRAOCULAR LENS IOL ; Right Eye (Z96.1) 4.  PCO; Right Eye (H26.491)  Plan: 1.  1 week after cataract surgery. Doing well with improved vision and normal eye pressure. Call with any problems or concerns. Stop drops - Dextenza. Call with any concerning symptoms.  2.  Cataract accounts for the patient's decreased vision. This visual impairment is not correctable with a tolerable change in glasses or contact lenses. Cataract surgery with an implantation of a new lens should significantly improve the visual and functional status of the patient. Discussed all risks, benefits, alternatives, and potential complications. Discussed the procedures and recovery. Patient desires to have surgery. A-scan ordered and performed today for intra-ocular lens calculations. The surgery will be performed in order to improve vision for driving, reading, and for eye examinations. Recommend phacoemulsification with intra-ocular lens. Recommend Dextenza for post-operative pain and inflammation. Left Eye. Surgery required to correct imbalance of vision. Dilates well - shugarcaine by protocol.  3.  Doing well since surgery  4.  Asymptomatic. Findings, prognosis and treatment options reviewed. No indication for laser at this point, will observe for changes.

## 2022-12-25 ENCOUNTER — Ambulatory Visit (HOSPITAL_COMMUNITY): Payer: 59 | Admitting: Anesthesiology

## 2022-12-25 ENCOUNTER — Ambulatory Visit (HOSPITAL_COMMUNITY)
Admission: RE | Admit: 2022-12-25 | Discharge: 2022-12-25 | Disposition: A | Discharge status: Home / Self Care | Payer: 59 | Attending: Ophthalmology | Admitting: Ophthalmology

## 2022-12-25 ENCOUNTER — Encounter (HOSPITAL_COMMUNITY)
Admission: RE | Disposition: A | Discharge status: Home / Self Care | Payer: Self-pay | Source: Home / Self Care | Attending: Ophthalmology

## 2022-12-25 DIAGNOSIS — Z87891 Personal history of nicotine dependence: Secondary | ICD-10-CM | POA: Insufficient documentation

## 2022-12-25 DIAGNOSIS — Z794 Long term (current) use of insulin: Secondary | ICD-10-CM | POA: Diagnosis not present

## 2022-12-25 DIAGNOSIS — H25812 Combined forms of age-related cataract, left eye: Secondary | ICD-10-CM | POA: Diagnosis not present

## 2022-12-25 DIAGNOSIS — Z9841 Cataract extraction status, right eye: Secondary | ICD-10-CM | POA: Diagnosis not present

## 2022-12-25 DIAGNOSIS — F32A Depression, unspecified: Secondary | ICD-10-CM | POA: Insufficient documentation

## 2022-12-25 DIAGNOSIS — Z961 Presence of intraocular lens: Secondary | ICD-10-CM | POA: Insufficient documentation

## 2022-12-25 DIAGNOSIS — I10 Essential (primary) hypertension: Secondary | ICD-10-CM | POA: Diagnosis not present

## 2022-12-25 DIAGNOSIS — E1136 Type 2 diabetes mellitus with diabetic cataract: Secondary | ICD-10-CM | POA: Diagnosis not present

## 2022-12-25 DIAGNOSIS — F419 Anxiety disorder, unspecified: Secondary | ICD-10-CM | POA: Insufficient documentation

## 2022-12-25 DIAGNOSIS — Z7984 Long term (current) use of oral hypoglycemic drugs: Secondary | ICD-10-CM | POA: Diagnosis not present

## 2022-12-25 LAB — GLUCOSE, CAPILLARY: Glucose-Capillary: 142 mg/dL — ABNORMAL HIGH (ref 70–99)

## 2022-12-25 SURGERY — CATARACT EXTRACTION PHACO AND INTRAOCULAR LENS PLACEMENT (IOC) with placement of Corticosteroid
Anesthesia: Monitor Anesthesia Care | Site: Eye | Laterality: Left

## 2022-12-25 MED ORDER — SODIUM HYALURONATE 23MG/ML IO SOSY
PREFILLED_SYRINGE | INTRAOCULAR | Status: DC | PRN
  Administered 2022-12-25: .6 mL via INTRAOCULAR

## 2022-12-25 MED ORDER — TROPICAMIDE 1 % OP SOLN
1.0000 [drp] | OPHTHALMIC | Status: AC | PRN
  Administered 2022-12-25 (×3): 1 [drp] via OPHTHALMIC

## 2022-12-25 MED ORDER — EPINEPHRINE PF 1 MG/ML IJ SOLN
INTRAOCULAR | Status: DC | PRN
  Administered 2022-12-25: 500 mL

## 2022-12-25 MED ORDER — TETRACAINE HCL 0.5 % OP SOLN
1.0000 [drp] | OPHTHALMIC | Status: AC | PRN
  Administered 2022-12-25 (×3): 1 [drp] via OPHTHALMIC

## 2022-12-25 MED ORDER — FENTANYL CITRATE (PF) 100 MCG/2ML IJ SOLN
INTRAMUSCULAR | Status: AC
  Filled 2022-12-25: qty 2

## 2022-12-25 MED ORDER — PHENYLEPHRINE HCL 2.5 % OP SOLN
1.0000 [drp] | OPHTHALMIC | Status: AC | PRN
  Administered 2022-12-25 (×3): 1 [drp] via OPHTHALMIC

## 2022-12-25 MED ORDER — LIDOCAINE HCL (PF) 1 % IJ SOLN
INTRAOCULAR | Status: DC | PRN
  Administered 2022-12-25: 1 mL via OPHTHALMIC

## 2022-12-25 MED ORDER — FENTANYL CITRATE (PF) 100 MCG/2ML IJ SOLN
INTRAMUSCULAR | Status: DC | PRN
  Administered 2022-12-25: 50 ug via INTRAVENOUS

## 2022-12-25 MED ORDER — SODIUM CHLORIDE 0.9% FLUSH
INTRAVENOUS | Status: DC | PRN
  Administered 2022-12-25: 5 mL via INTRAVENOUS

## 2022-12-25 MED ORDER — LIDOCAINE HCL 3.5 % OP GEL
1.0000 | Freq: Once | OPHTHALMIC | Status: AC
  Administered 2022-12-25: 1 via OPHTHALMIC

## 2022-12-25 MED ORDER — POVIDONE-IODINE 5 % OP SOLN
OPHTHALMIC | Status: DC | PRN
  Administered 2022-12-25: 1 via OPHTHALMIC

## 2022-12-25 MED ORDER — MIDAZOLAM HCL 2 MG/2ML IJ SOLN
INTRAMUSCULAR | Status: AC
Start: 2022-12-25 — End: ?
  Filled 2022-12-25: qty 2

## 2022-12-25 MED ORDER — STERILE WATER FOR IRRIGATION IR SOLN
Status: DC | PRN
  Administered 2022-12-25: 1

## 2022-12-25 MED ORDER — BSS IO SOLN
INTRAOCULAR | Status: DC | PRN
  Administered 2022-12-25: 15 mL via INTRAOCULAR

## 2022-12-25 MED ORDER — MOXIFLOXACIN HCL 5 MG/ML IO SOLN
INTRAOCULAR | Status: DC | PRN
  Administered 2022-12-25: .3 mL via INTRACAMERAL

## 2022-12-25 MED ORDER — SODIUM HYALURONATE 10 MG/ML IO SOLUTION
PREFILLED_SYRINGE | INTRAOCULAR | Status: DC | PRN
  Administered 2022-12-25: .85 mL via INTRAOCULAR

## 2022-12-25 MED ORDER — MIDAZOLAM HCL 5 MG/5ML IJ SOLN
INTRAMUSCULAR | Status: DC | PRN
  Administered 2022-12-25: 1.5 mg via INTRAVENOUS

## 2022-12-25 SURGICAL SUPPLY — 13 items
CATARACT SUITE SIGHTPATH (MISCELLANEOUS) ×1
CLOTH BEACON ORANGE TIMEOUT ST (SAFETY) ×1 IMPLANT
EYE SHIELD UNIVERSAL CLEAR (GAUZE/BANDAGES/DRESSINGS) IMPLANT
FEE CATARACT SUITE SIGHTPATH (MISCELLANEOUS) ×1 IMPLANT
GLOVE BIOGEL PI IND STRL 7.0 (GLOVE) ×2 IMPLANT
LENS IOL TECNIS EYHANCE 15.5 (Intraocular Lens) IMPLANT
NDL HYPO 18GX1.5 BLUNT FILL (NEEDLE) ×1 IMPLANT
NEEDLE HYPO 18GX1.5 BLUNT FILL (NEEDLE) ×1
PAD ARMBOARD 7.5X6 YLW CONV (MISCELLANEOUS) ×1 IMPLANT
POSITIONER HEAD 8X9X4 ADT (SOFTGOODS) ×1 IMPLANT
SYR TB 1ML LL NO SAFETY (SYRINGE) ×1 IMPLANT
TAPE SURG TRANSPORE 1 IN (GAUZE/BANDAGES/DRESSINGS) IMPLANT
WATER STERILE IRR 250ML POUR (IV SOLUTION) ×1 IMPLANT

## 2022-12-25 NOTE — Op Note (Signed)
Date of procedure: 12/25/22  Pre-operative diagnosis: Visually significant age-related combined cataract, Left Eye (H25.812)  Post-operative diagnosis: Visually significant age-related combined cataract, Left Eye (H25.812)  Procedure: Removal of cataract via phacoemulsification and insertion of intra-ocular lens Laural Benes and Johnson DIB00 +15.5D into the capsular bag of the Left Eye  Attending surgeon: Rudy Jew. Kelvyn Schunk, MD, MA  Anesthesia: MAC, Topical Akten  Complications: None  Estimated Blood Loss: <72mL (minimal)  Specimens: None  Implants: As above  Indications:  Visually significant age-related cataract, Left Eye  Procedure:  The patient was seen and identified in the pre-operative area. The operative eye was identified and dilated.  The operative eye was marked.  Topical anesthesia was administered to the operative eye.     The patient was then to the operative suite and placed in the supine position.  A timeout was performed confirming the patient, procedure to be performed, and all other relevant information.   The patient's face was prepped and draped in the usual fashion for intra-ocular surgery.  A lid speculum was placed into the operative eye and the surgical microscope moved into place and focused.  An inferotemporal paracentesis was created using a 20 gauge paracentesis blade.  Shugarcaine was injected into the anterior chamber.  Viscoelastic was injected into the anterior chamber.  A temporal clear-corneal main wound incision was created using a 2.81mm microkeratome.  A continuous curvilinear capsulorrhexis was initiated using an irrigating cystitome and completed using capsulorrhexis forceps.  Hydrodissection and hydrodeliniation were performed.  Viscoelastic was injected into the anterior chamber.  A phacoemulsification handpiece and a chopper as a second instrument were used to remove the nucleus and epinucleus. The irrigation/aspiration handpiece was used to remove any  remaining cortical material.   The capsular bag was reinflated with viscoelastic, checked, and found to be intact.  The intraocular lens was inserted into the capsular bag.  The irrigation/aspiration handpiece was used to remove any remaining viscoelastic.  The clear corneal wound and paracentesis wounds were then hydrated and checked with Weck-Cels to be watertight. 0.78mL of Moxfloxacin was injected into the anterior chamber. The lid-speculum was removed.  The drape was removed.  The patient's face was cleaned with a wet and dry 4x4.    A clear shield was taped over the eye. The patient was taken to the post-operative care unit in good condition, having tolerated the procedure well.  Post-Op Instructions: The patient will follow up at [facility] for a same day post-operative evaluation and will receive all other orders and instructions.

## 2022-12-25 NOTE — Transfer of Care (Signed)
Immediate Anesthesia Transfer of Care Note  Patient: Sabrina Thompson  Procedure(s) Performed: CATARACT EXTRACTION PHACO AND INTRAOCULAR LENS PLACEMENT (IOC) (Left: Eye)  Patient Location: Short Stay  Anesthesia Type:MAC  Level of Consciousness: awake and patient cooperative  Airway & Oxygen Therapy: Patient Spontanous Breathing  Post-op Assessment: Report given to RN and Post -op Vital signs reviewed and stable  Post vital signs: Reviewed and stable  Last Vitals:  Vitals Value Taken Time  BP    Temp    Pulse    Resp    SpO2      Last Pain:  Vitals:   12/25/22 0954  TempSrc: Oral  PainSc: 0-No pain      Patients Stated Pain Goal: 5 (12/25/22 0954)  Complications: No notable events documented.

## 2022-12-25 NOTE — Anesthesia Preprocedure Evaluation (Addendum)
Anesthesia Evaluation  Patient identified by MRN, date of birth, ID band Patient awake    Reviewed: Allergy & Precautions, H&P , NPO status , Patient's Chart, lab work & pertinent test results, reviewed documented beta blocker date and time   Airway Mallampati: II  TM Distance: >3 FB Neck ROM: full    Dental  (+) Dental Advisory Given, Missing Several missing upper teeth but denies any loose:   Pulmonary former smoker   Pulmonary exam normal breath sounds clear to auscultation (-) decreased breath sounds      Cardiovascular Exercise Tolerance: Good hypertension, Normal cardiovascular exam Rhythm:regular Rate:Normal     Neuro/Psych  PSYCHIATRIC DISORDERS Anxiety Depression     Neuromuscular disease    GI/Hepatic negative GI ROS, Neg liver ROS,,,  Endo/Other  diabetes    Renal/GU negative Renal ROS  negative genitourinary   Musculoskeletal   Abdominal Normal abdominal exam  (+)   Peds  Hematology  (+) Blood dyscrasia, anemia   Anesthesia Other Findings   Reproductive/Obstetrics negative OB ROS                             Anesthesia Physical Anesthesia Plan  ASA: 3  Anesthesia Plan: MAC   Post-op Pain Management:    Induction:   PONV Risk Score and Plan:   Airway Management Planned: Nasal Cannula and Natural Airway  Additional Equipment: None  Intra-op Plan:   Post-operative Plan:   Informed Consent: I have reviewed the patients History and Physical, chart, labs and discussed the procedure including the risks, benefits and alternatives for the proposed anesthesia with the patient or authorized representative who has indicated his/her understanding and acceptance.     Dental Advisory Given  Plan Discussed with: CRNA  Anesthesia Plan Comments:        Anesthesia Quick Evaluation

## 2022-12-25 NOTE — Anesthesia Postprocedure Evaluation (Signed)
Anesthesia Post Note  Patient: QUINTA BAUTZ  Procedure(s) Performed: CATARACT EXTRACTION PHACO AND INTRAOCULAR LENS PLACEMENT (IOC) (Left: Eye)  Patient location during evaluation: PACU Anesthesia Type: MAC Level of consciousness: awake and alert Pain management: pain level controlled Vital Signs Assessment: post-procedure vital signs reviewed and stable Respiratory status: spontaneous breathing, nonlabored ventilation, respiratory function stable and patient connected to nasal cannula oxygen Cardiovascular status: stable and blood pressure returned to baseline Postop Assessment: no apparent nausea or vomiting Anesthetic complications: no   There were no known notable events for this encounter.   Last Vitals:  Vitals:   12/25/22 0954 12/25/22 1058  BP: (!) 153/74 (!) 143/68  Pulse: 70 69  Resp: 18 16  Temp: 36.7 C 36.8 C  SpO2: 100% 99%    Last Pain:  Vitals:   12/25/22 1058  TempSrc: Oral  PainSc: 0-No pain                 Jazleen Robeck L Angell Pincock

## 2022-12-25 NOTE — Discharge Instructions (Addendum)
Please discharge patient when stable, will follow up today with Dr. Carnella Fryman at the [facility] office immediately following discharge.  Leave shield in place until visit.  All paperwork with discharge instructions will be given at the office.  [facility] Address:  [address]  [place], [place] [address]  

## 2022-12-25 NOTE — Interval H&P Note (Signed)
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  The various methods of treatment have been discussed with the patient and family. After consideration of risks, benefits and other options for treatment, the patient has consented to  Procedure(s): CATARACT EXTRACTION PHACO AND INTRAOCULAR LENS PLACEMENT (IOC) (Left) as a surgical intervention.  The patient's history has been reviewed, patient examined, no change in status, stable for surgery.  I have reviewed the patient's chart and labs.  Questions were answered to the patient's satisfaction.     Fabio Pierce

## 2022-12-26 ENCOUNTER — Encounter: Payer: Self-pay | Admitting: Orthopedic Surgery

## 2022-12-26 ENCOUNTER — Other Ambulatory Visit (INDEPENDENT_AMBULATORY_CARE_PROVIDER_SITE_OTHER): Payer: 59

## 2022-12-26 ENCOUNTER — Ambulatory Visit (INDEPENDENT_AMBULATORY_CARE_PROVIDER_SITE_OTHER): Payer: 59 | Admitting: Orthopedic Surgery

## 2022-12-26 VITALS — BP 126/86 | HR 68 | Ht 65.0 in | Wt 195.0 lb

## 2022-12-26 DIAGNOSIS — M25561 Pain in right knee: Secondary | ICD-10-CM | POA: Diagnosis not present

## 2022-12-26 DIAGNOSIS — G8929 Other chronic pain: Secondary | ICD-10-CM

## 2022-12-26 DIAGNOSIS — M1711 Unilateral primary osteoarthritis, right knee: Secondary | ICD-10-CM | POA: Diagnosis not present

## 2022-12-26 DIAGNOSIS — M12811 Other specific arthropathies, not elsewhere classified, right shoulder: Secondary | ICD-10-CM

## 2022-12-26 MED ORDER — METHYLPREDNISOLONE ACETATE 40 MG/ML IJ SUSP
40.0000 mg | Freq: Once | INTRAMUSCULAR | Status: AC
  Administered 2022-12-26: 40 mg via INTRA_ARTICULAR

## 2022-12-26 NOTE — Progress Notes (Signed)
Orthopaedic Clinic Return  Assessment: Sabrina Thompson is a 67 y.o. female with the following: Acute right knee pain Right shoulder rotator cuff arthropathy  Plan: Sabrina Thompson has acute onset of pain in the right knee.  She states that she was at a wedding recently, when she had sudden onset of pain.  She does not remember falling.  No specific twisting event.  She had immediate swelling.  Since then, the pain has improved.  Radiographs demonstrate some mild to moderate degenerative changes.  Medications have not provided sustained relief.  I have recommended a steroid injection, and this was completed in clinic today.  In regards to her right shoulder, she continues to have decreased function, as well as pain.  She has been followed closely by her primary care provider.  She states her sodium is better.  She states her hemoglobin A1c is a little bit higher, but should be within normal limits.  The procedure on her shoulder was discussed in great detail.  I will request time in the operating room, and we will work to confirm medical clearance.  Once this has been obtained, we will work to schedule surgery.  Procedure note injection Right knee joint   Verbal consent was obtained to inject the right knee joint  Timeout was completed to confirm the site of injection.  The skin was prepped with alcohol and ethyl chloride was sprayed at the injection site.  A 21-gauge needle was used to inject 40 mg of Depo-Medrol and 1% lidocaine (4 cc) into the right knee using an anterolateral approach.  There were no complications. A sterile bandage was applied.   Follow-up: Return if symptoms worsen or fail to improve.   Subjective:  Chief Complaint  Patient presents with   Knee Pain    Right   NDC: [address]-9    History of Present Illness: Sabrina Thompson is a 67 y.o. female who returns to clinic for evaluation of right knee pain.  She is well-known to my clinic.  I have seen her multiple times.   She was scheduled for right shoulder reverse arthroplasty, approximately 6 months ago, but the surgery was postponed due to lab abnormalities.  She states these have corrected.  Her knee started to bother her a couple of weeks ago.  She was at a family wedding, when she had acute onset of pain in the medial aspect of the right knee, as well as some burning pains distally.  Since then, she has had difficulty with pain in the medial aspect of the right knee.  Medications have not been effective.  She is using a cane to assist with ambulation.  Review of Systems: No fevers or chills No numbness or tingling No chest pain No shortness of breath No bowel or bladder dysfunction No GI distress No headaches   Objective: BP 126/86   Pulse 68   Ht 5\' 5"  (1.651 m)   Wt 195 lb (88.5 kg)   LMP 02/13/2005 (Approximate)   BMI 32.45 kg/m   Physical Exam:  Alert and oriented.  No acute distress.  Right sided antalgic gait.  Right knee with a mild effusion.  Tenderness palpation along the medial joint line.  She is good range of motion.  Toes warm well-perfused.  Negative Lachman.  No increased laxity varus valgus stress.  IMAGING: I personally ordered and reviewed the following images:  X-rays of the right knee were obtained in clinic today.  No acute injuries are noted.  Neutral overall  alignment.  Mild loss of joint space within the medial lateral compartments.  Small osteophytes are appreciated.  No dislocation.  No bony lesions.  Impression: Right knee x-rays with mild to moderate degenerative changes.   Oliver Barre, MD 12/26/2022 10:30 PM

## 2022-12-26 NOTE — Patient Instructions (Signed)
Instructions Following Joint Injections  In clinic today, you received an injection in one of your joints (sometimes more than one).  Occasionally, you can have some pain at the injection site, this is normal.  You can place ice at the injection site, or take over-the-counter medications such as Tylenol (acetaminophen) or Advil (ibuprofen).  Please follow all directions listed on the bottle.  If your joint (knee or shoulder) becomes swollen, red or very painful, please contact the clinic for additional assistance.   Two medications were injected, including lidocaine and a steroid (often referred to as cortisone).  Lidocaine is effective almost immediately but wears off quickly.  However, the steroid can take a few days to improve your symptoms.  In some cases, it can make your pain worse for a couple of days.  Do not be concerned if this happens as it is common.  You can apply ice or take some over-the-counter medications as needed.   Injections in the same joint cannot be repeated for 3 months.  This helps to limit the risk of an infection in the joint.  If you were to develop an infection in your joint, the best treatment option would be surgery.   Shoulder replacement   We discussed proceeding with right shoulder surgery.  Will reach out to your primary care doctor to confirm medical clearance.  I will request time in the operating room.  Once we have clearance, we will work to schedule surgery.  I recommend that someone be available at all times for at least the first week following surgery.  We can also make plans so you have some assistance from home health.  You will spend 1 night in the hospital.

## 2023-01-17 DIAGNOSIS — E1165 Type 2 diabetes mellitus with hyperglycemia: Secondary | ICD-10-CM | POA: Diagnosis not present

## 2023-01-24 ENCOUNTER — Telehealth: Payer: Self-pay | Admitting: Orthopedic Surgery

## 2023-01-24 NOTE — Telephone Encounter (Signed)
Dr. Dallas Schimke pt - pt lvm stating she was returning a call to Dr. Dallas Schimke nurse and that she'll be home until 3pm today.

## 2023-01-24 NOTE — Telephone Encounter (Signed)
Messaged April Beavers to see if she'd reached out to patient and she had not. Called and left VM with pt to let her know we didn't call and to call back if she has questions or concerns.

## 2023-01-29 ENCOUNTER — Ambulatory Visit: Payer: 59 | Admitting: Podiatry

## 2023-01-30 ENCOUNTER — Encounter: Payer: Self-pay | Admitting: Orthopedic Surgery

## 2023-01-30 ENCOUNTER — Ambulatory Visit: Payer: 59 | Admitting: Orthopedic Surgery

## 2023-01-30 DIAGNOSIS — G8929 Other chronic pain: Secondary | ICD-10-CM

## 2023-01-30 DIAGNOSIS — M25561 Pain in right knee: Secondary | ICD-10-CM | POA: Diagnosis not present

## 2023-01-30 DIAGNOSIS — M25461 Effusion, right knee: Secondary | ICD-10-CM | POA: Diagnosis not present

## 2023-01-30 NOTE — Patient Instructions (Signed)
Wear the brace at all times when walking  Use walker to offload the right knee

## 2023-02-01 NOTE — Progress Notes (Signed)
Orthopaedic Clinic Return  Assessment: Sabrina Thompson is a 67 y.o. female with the following: Acute right knee pain Right shoulder rotator cuff arthropathy  Plan: Sabrina Thompson continues to have pain in her right knee.  She does have an effusion in clinic today.  She states the injection only lasted for 1-2 weeks.  She does have tenderness to palpation over the proximal tibia, as well as some tenderness within the medial femoral condyle.  I am concerned that she has a bone bruise.  I have offered to aspirate her right knee.  This was completed in clinic today.  Otherwise, I am recommending a brace, and use of a walker to help offload the right knee.  She is in agreement with this plan.  I would like to see her in about a month to assess her improvement.  Procedure note - aspiration right knee joint   Verbal consent was obtained to aspirate the right knee joint  Timeout was completed to confirm the site of aspiration. The skin was prepped with alcohol and ethyl chloride was sprayed at the aspiration site.  An 18-gauge needle was inserted and 30 cc of clear joint fluid was aspirated using a superolateral approach.  There were no complications. A sterile bandage was applied.   Follow-up: Return in about 1 month (around 03/02/2023).   Subjective:  Chief Complaint  Patient presents with   Knee Pain    Still having pain states shot lasted for 1.5 wks.     History of Present Illness: Sabrina Thompson is a 67 y.o. female who returns to clinic for evaluation of right knee pain.  I saw her in clinic approximately 1 month ago.  At that time, she had acute onset of right knee pain.  Pain is primarily in the medial aspect of the knee.  Her knee was injected, and she had improvement in her symptoms for 1-2 weeks.  However, since then, she continues to have pain.  She also notes swelling of the right knee.  She is not using a brace.  No assistive device.     Review of Systems: No fevers or  chills No numbness or tingling No chest pain No shortness of breath No bowel or bladder dysfunction No GI distress No headaches   Objective: LMP 02/13/2005 (Approximate)   Physical Exam:  Alert and oriented.  No acute distress.  Right sided antalgic gait.  Right knee with a moderate effusion.  Tenderness palpation along the medial joint line.  She is more tender in the medial tibial plateau.  Mild tenderness to palpation along the medial femoral condyle.  She is good range of motion.  Toes warm well-perfused.  Negative Lachman.  No increased laxity varus valgus stress.  IMAGING: I personally ordered and reviewed the following images:  No new imaging obtained today.  Oliver Barre, MD 02/01/2023 8:20 AM

## 2023-02-16 ENCOUNTER — Ambulatory Visit: Payer: 59 | Admitting: Podiatry

## 2023-02-17 DIAGNOSIS — E1165 Type 2 diabetes mellitus with hyperglycemia: Secondary | ICD-10-CM | POA: Diagnosis not present

## 2023-02-26 DIAGNOSIS — E871 Hypo-osmolality and hyponatremia: Secondary | ICD-10-CM | POA: Diagnosis not present

## 2023-02-26 DIAGNOSIS — I10 Essential (primary) hypertension: Secondary | ICD-10-CM | POA: Diagnosis not present

## 2023-02-26 DIAGNOSIS — E1165 Type 2 diabetes mellitus with hyperglycemia: Secondary | ICD-10-CM | POA: Diagnosis not present

## 2023-03-01 DIAGNOSIS — J309 Allergic rhinitis, unspecified: Secondary | ICD-10-CM | POA: Insufficient documentation

## 2023-03-01 DIAGNOSIS — E782 Mixed hyperlipidemia: Secondary | ICD-10-CM | POA: Diagnosis not present

## 2023-03-01 DIAGNOSIS — E1165 Type 2 diabetes mellitus with hyperglycemia: Secondary | ICD-10-CM | POA: Diagnosis not present

## 2023-03-01 DIAGNOSIS — E1140 Type 2 diabetes mellitus with diabetic neuropathy, unspecified: Secondary | ICD-10-CM | POA: Diagnosis not present

## 2023-03-01 DIAGNOSIS — L682 Localized hypertrichosis: Secondary | ICD-10-CM | POA: Diagnosis not present

## 2023-03-01 DIAGNOSIS — I10 Essential (primary) hypertension: Secondary | ICD-10-CM | POA: Diagnosis not present

## 2023-03-01 DIAGNOSIS — E871 Hypo-osmolality and hyponatremia: Secondary | ICD-10-CM | POA: Diagnosis not present

## 2023-03-01 DIAGNOSIS — G4700 Insomnia, unspecified: Secondary | ICD-10-CM | POA: Diagnosis not present

## 2023-03-01 DIAGNOSIS — M21372 Foot drop, left foot: Secondary | ICD-10-CM | POA: Diagnosis not present

## 2023-03-01 DIAGNOSIS — N3281 Overactive bladder: Secondary | ICD-10-CM | POA: Diagnosis not present

## 2023-03-02 ENCOUNTER — Encounter: Payer: Self-pay | Admitting: Orthopedic Surgery

## 2023-03-02 ENCOUNTER — Ambulatory Visit (INDEPENDENT_AMBULATORY_CARE_PROVIDER_SITE_OTHER): Payer: 59 | Admitting: Orthopedic Surgery

## 2023-03-02 VITALS — BP 139/94 | HR 80 | Ht 65.0 in | Wt 195.0 lb

## 2023-03-02 DIAGNOSIS — M25461 Effusion, right knee: Secondary | ICD-10-CM | POA: Diagnosis not present

## 2023-03-02 DIAGNOSIS — M25561 Pain in right knee: Secondary | ICD-10-CM

## 2023-03-02 DIAGNOSIS — M25361 Other instability, right knee: Secondary | ICD-10-CM | POA: Diagnosis not present

## 2023-03-02 DIAGNOSIS — G8929 Other chronic pain: Secondary | ICD-10-CM | POA: Diagnosis not present

## 2023-03-02 NOTE — Progress Notes (Signed)
Orthopaedic Clinic Return  Assessment: Sabrina Thompson is a 68 y.o. female with the following: Acute right knee pain; medial pain, medial meniscus versus bone bruising  Plan: Mrs. Linzer continues to have pain in her right knee.  She feels better when she is wearing the brace, but continues to have severe pain, especially when she is out of the brace.  She has been using a walker.  Injection was not helpful.  Swelling has improved.  Home exercises have not been effective.  Given the persistent pain she has in the medial aspect of the right knee, as well as the poor response to nonoperative treatment including medications, injections, immobilization, protected weightbearing and home exercises, have recommended an MRI of the right knee.  We will see her in clinic to discuss the findings.  We briefly discussed proceeding with surgery on her right shoulder, but we are both in agreement that we should address her right knee first.   Follow-up: Return for After MRI.   Subjective:  Chief Complaint  Patient presents with   Knee Pain    R knee pain had gotten a little worse     History of Present Illness: Sabrina Thompson is a 68 y.o. female who returns to clinic for evaluation of right knee pain.  She reports acute onset of pain in the right knee, a couple of months ago.  Pain is in the medial aspect of the right knee.  Prior injection helped for 1-2 weeks.  Swelling overall is better.  The brace is helping, but is not relieving her pain.  She continues to use a walker.  She has been trying to do some exercises at home.  She notes occasional catching and sharp pains in the medial aspect of the right knee.  This causes her to buckle.  She has not fallen.    Review of Systems: No fevers or chills No numbness or tingling No chest pain No shortness of breath No bowel or bladder dysfunction No GI distress No headaches   Objective: BP (!) 139/94   Pulse 80   Ht 5\' 5"  (1.651 m)   Wt 195 lb  (88.5 kg)   LMP 02/13/2005 (Approximate)   BMI 32.45 kg/m   Physical Exam:  Alert and oriented.  No acute distress.  Right sided antalgic gait.  Right knee with a mild effusion.  Tenderness palpation along the medial joint line.  She is more tender in the medial tibial plateau.  Mild tenderness to palpation along the medial femoral condyle.  She is good range of motion.  Toes warm well-perfused.  Negative Lachman.  No increased laxity varus valgus stress.  Pain gets worse with hyperflexion.    IMAGING: I personally ordered and reviewed the following images:  No new imaging obtained today.  Oliver Barre, MD 03/02/2023 10:11 AM

## 2023-03-06 ENCOUNTER — Other Ambulatory Visit (HOSPITAL_COMMUNITY): Payer: Self-pay | Admitting: Nurse Practitioner

## 2023-03-06 DIAGNOSIS — Z122 Encounter for screening for malignant neoplasm of respiratory organs: Secondary | ICD-10-CM

## 2023-03-06 DIAGNOSIS — Z1231 Encounter for screening mammogram for malignant neoplasm of breast: Secondary | ICD-10-CM

## 2023-03-08 ENCOUNTER — Ambulatory Visit (INDEPENDENT_AMBULATORY_CARE_PROVIDER_SITE_OTHER): Payer: 59 | Admitting: Podiatry

## 2023-03-08 ENCOUNTER — Encounter: Payer: Self-pay | Admitting: Podiatry

## 2023-03-08 DIAGNOSIS — M2042 Other hammer toe(s) (acquired), left foot: Secondary | ICD-10-CM

## 2023-03-08 DIAGNOSIS — M79675 Pain in left toe(s): Secondary | ICD-10-CM

## 2023-03-08 DIAGNOSIS — B351 Tinea unguium: Secondary | ICD-10-CM | POA: Diagnosis not present

## 2023-03-08 DIAGNOSIS — M2041 Other hammer toe(s) (acquired), right foot: Secondary | ICD-10-CM | POA: Diagnosis not present

## 2023-03-08 DIAGNOSIS — M21372 Foot drop, left foot: Secondary | ICD-10-CM

## 2023-03-08 DIAGNOSIS — M79674 Pain in right toe(s): Secondary | ICD-10-CM

## 2023-03-08 DIAGNOSIS — E1142 Type 2 diabetes mellitus with diabetic polyneuropathy: Secondary | ICD-10-CM

## 2023-03-08 DIAGNOSIS — L84 Corns and callosities: Secondary | ICD-10-CM

## 2023-03-08 NOTE — Patient Instructions (Signed)
Look for urea 40% cream or ointment and apply to the thickened dry skin / calluses. This can be bought over the counter, at a pharmacy or online such as [employer].  May also look for combination product with salicylic acid.  Alternatively you can scrub the areas of callus with white vinegar to help keep them soft.  Please also make it easier to gently file down with pumice stone or emery board.

## 2023-03-08 NOTE — Progress Notes (Signed)
Subjective:  Patient ID: CHAZITY BRU, female    DOB: 12/09/1955,  MRN: [id number]  Chief Complaint  Patient presents with   Debridement    Trim toenails/calluses-diabetic - 7.5    68 y.o. female presents today for toenail trim with concern of thickened elongated and painful nails that are difficult to trim. Requesting to have them trimmed today.  She is unable to maintain them herself due to their thickness and length and due to mobility issues relates burning and tingling in their feet.  She also complains of painful preulcerative calluses present to both great toe joints into the right first toe.  She endorses history of dropfoot and states that she needs a brace to assist with ambulation as she feels unsteady with walking.  He is also requesting diabetic shoes.  Patient is diabetic and last A1c was  Lab Results  Component Value Date   HGBA1C 7.5 (H) 06/29/2022   .   PCP:  Micael Hampshire, FNP   Denies any other pedal complaints.   Objective:  Physical Exam: warm to cool, good capillary refill, pedal skin atrophic, pedal hair growth absent.   Nail exam notable for increased thickness, increased length, nails dystrophic in appearance with yellow discoloration and subungual debris. DP pulses palpable, PT pulses palpable, and protective sensation absent, vibratory sensation increased. Left Foot: hammertoe deformities noted; dropfoot left. 2nd toe rigid deformity 3rd-5th toes reducible.  Hyperkeratotic preulcerative lesion present subfirst MPJ plantar aspect.  Nail plates x 5 are tender on direct dorsal palpation Right Foot: hammertoe deformities noted.  Hyperkeratotic lesions preulcerative in nature present to the right subfirst MPJ plantar aspect and plantar medial first IPJ.  No plate x 5 are tender on direct dorsal palpation  Muscle strength decreased bilaterally, 4/5 for all major muscle groups right lower extremity, 1/5 in dorsiflexion to left lower extremity, 3/5 plantarflexion,  inversion, eversion.   No images are attached to the encounter.  Assessment:   1. Pain due to onychomycosis of toenails of both feet   2. DM type 2 with diabetic peripheral neuropathy (HCC)   3. Callus   4. Left foot drop   5. Hammertoes of both feet       Plan:  Patient was evaluated and treated and all questions answered.  # Preulcerative callus to bilateral subfirst metatarsal head, right first IPJ -All symptomatic hyperkeratoses x 3 were safely debrided with a sterile #312 blade to patient's level of comfort without incident. We discussed preventative and palliative care of these lesions including supportive and accommodative shoegear, padding, prefabricated and custom molded accommodative orthoses, use of a pumice stone and lotions/creams daily.   # Pain due to onychomycosis of toenails -Nail plates x 10 were debrided in thickness and length using sterile nail nippers without incident - Mechanical bur used to file the nails down  # Diabetes with neuropathy # Left foot drop # Hammertoe deformity -Patient educated on diabetes. Discussed proper diabetic foot care and discussed risks and complications of disease. Educated patient in depth on reasons to return to the office immediately should he/she discover anything concerning or new on the feet. All questions answered. Discussed proper shoes as well.   -Prescription written for diabetic shoes with inserts x 3 due to diabetes with neuropathy, preulcerative calluses, hammertoe deformities, trophic changes to skin with absent pedal hair growth -Prescription written for custom AFO brace due to left foot drop foot as patient is unsteady with ambulation. -I certify that this diagnosis represents a distinct and  separate diagnosis that requires evaluation and treatment separate from other procedures or diagnosis     Return in about 3 months (around 06/06/2023) for Diabetic Foot Care.

## 2023-03-14 ENCOUNTER — Ambulatory Visit (HOSPITAL_COMMUNITY)
Admission: RE | Admit: 2023-03-14 | Discharge: 2023-03-14 | Disposition: A | Discharge status: Home / Self Care | Payer: 59 | Source: Ambulatory Visit | Attending: Orthopedic Surgery | Admitting: Orthopedic Surgery

## 2023-03-14 DIAGNOSIS — M25561 Pain in right knee: Secondary | ICD-10-CM | POA: Insufficient documentation

## 2023-03-14 DIAGNOSIS — M25461 Effusion, right knee: Secondary | ICD-10-CM | POA: Insufficient documentation

## 2023-03-14 DIAGNOSIS — G8929 Other chronic pain: Secondary | ICD-10-CM | POA: Insufficient documentation

## 2023-03-14 DIAGNOSIS — S83281A Other tear of lateral meniscus, current injury, right knee, initial encounter: Secondary | ICD-10-CM | POA: Diagnosis not present

## 2023-03-14 DIAGNOSIS — M948X6 Other specified disorders of cartilage, lower leg: Secondary | ICD-10-CM | POA: Diagnosis not present

## 2023-03-17 IMAGING — MG MM DIGITAL SCREENING BILAT W/ TOMO AND CAD
6 of 10 series · 6 of 30 positions shown · non-contrast
Comparison: Previous exam(s).

CLINICAL DATA: Screening.

EXAM:
DIGITAL SCREENING BILATERAL MAMMOGRAM WITH TOMOSYNTHESIS AND CAD
TECHNIQUE: Bilateral screening digital craniocaudal and mediolateral oblique
mammograms were obtained. Bilateral screening digital breast
tomosynthesis was performed. The images were evaluated with
computer-aided detection.

[R CC synth-2D]
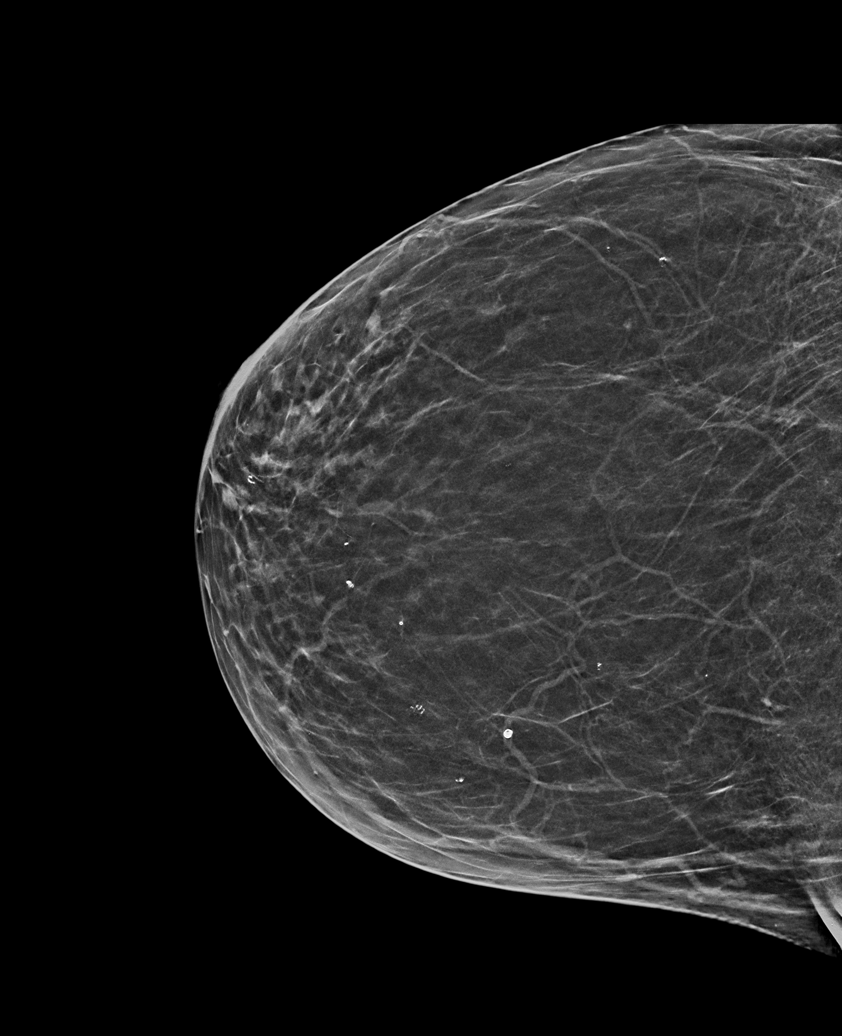

[R MLO synth-2D (1 of 2)]
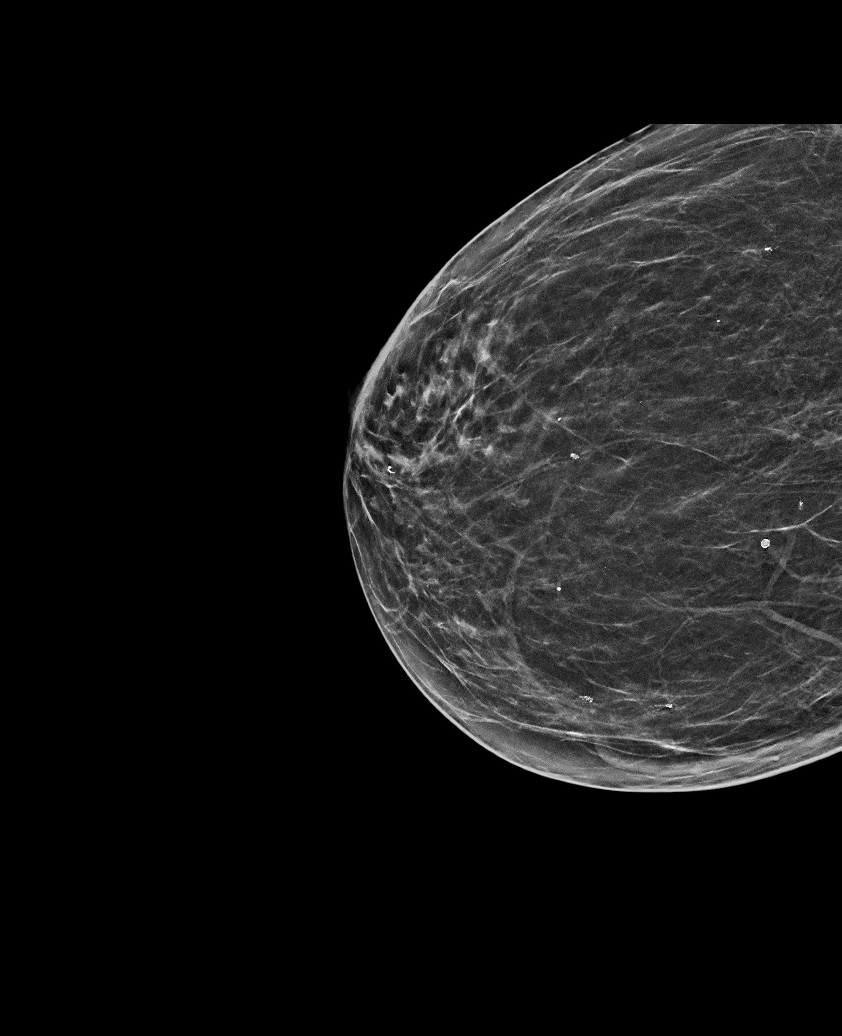

[R MLO synth-2D (2 of 2)]
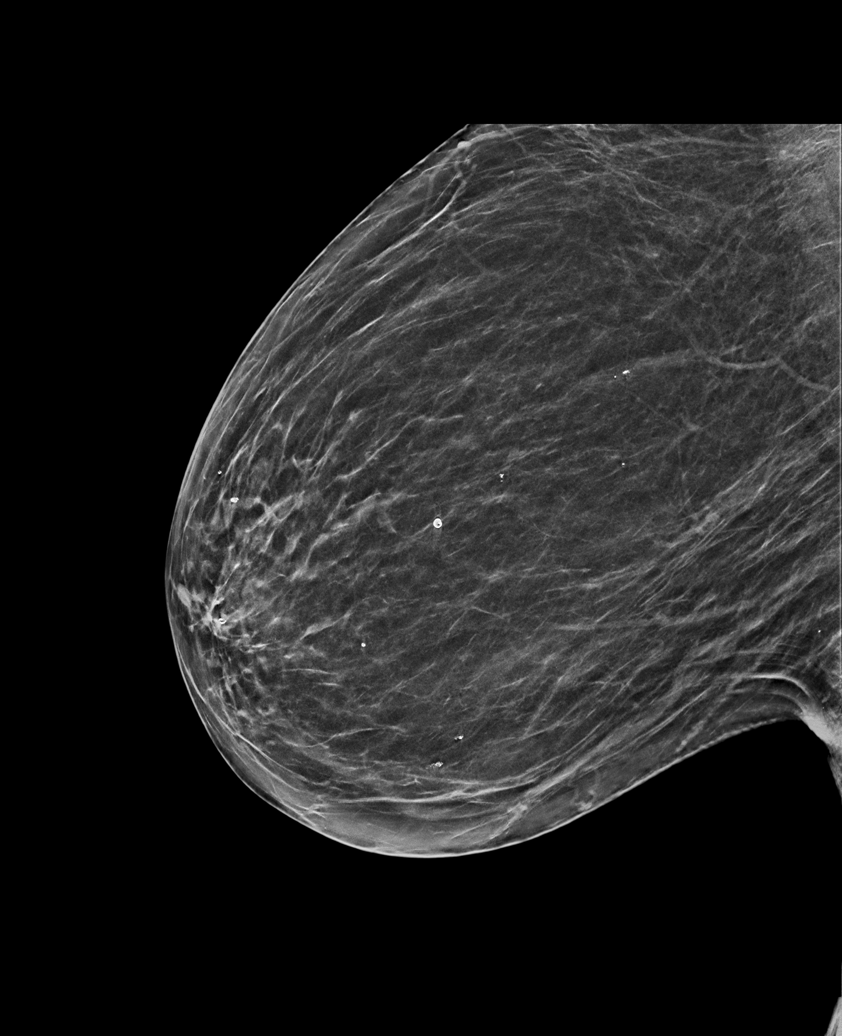

[L CC synth-2D]
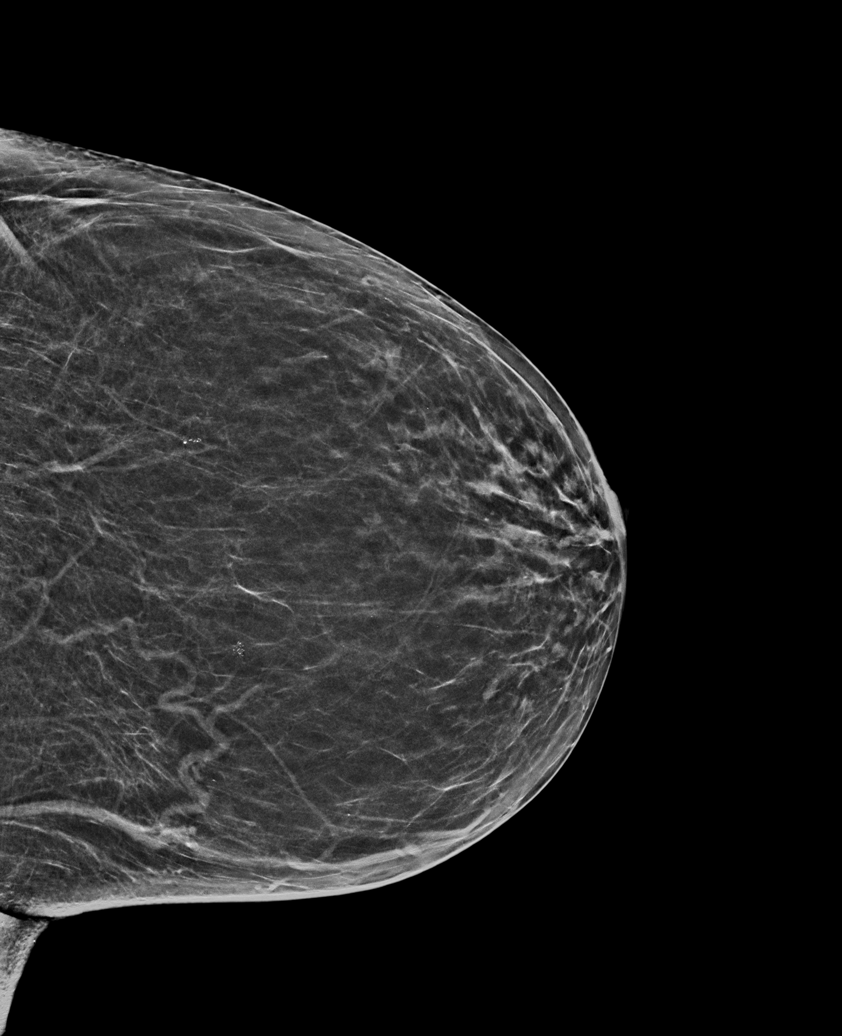

[L MLO synth-2D]
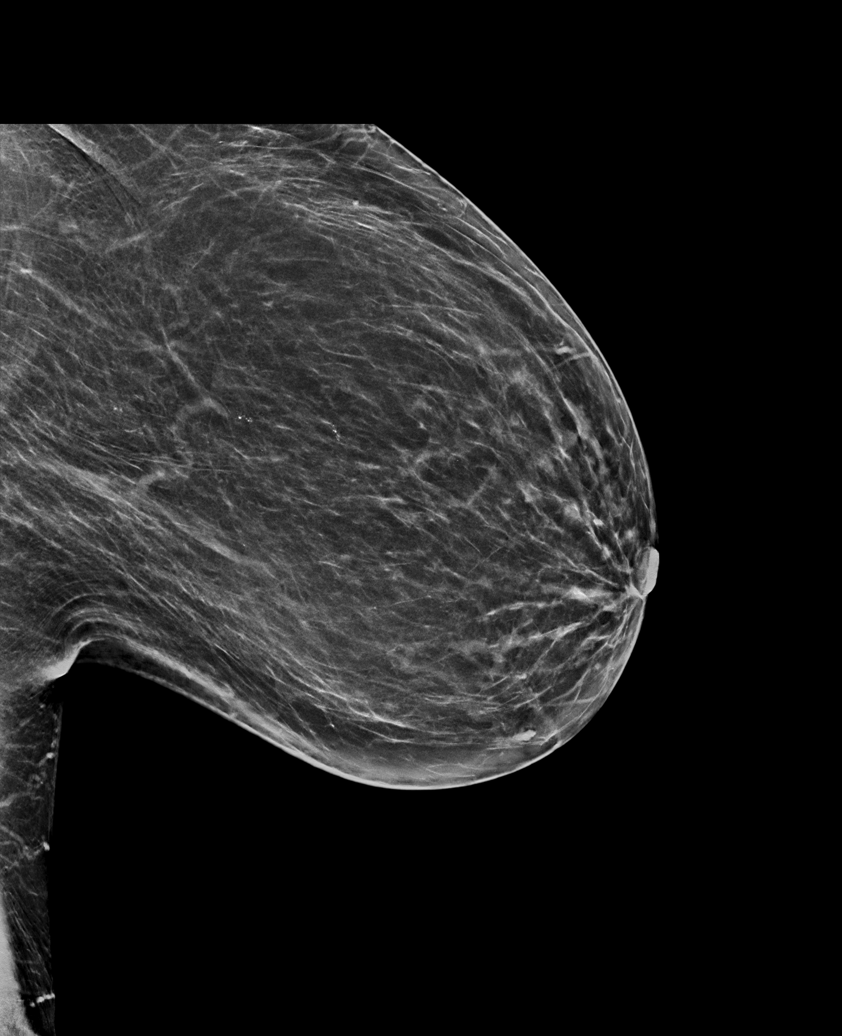

[R MLO tomo · tomo slice 31/61.0]
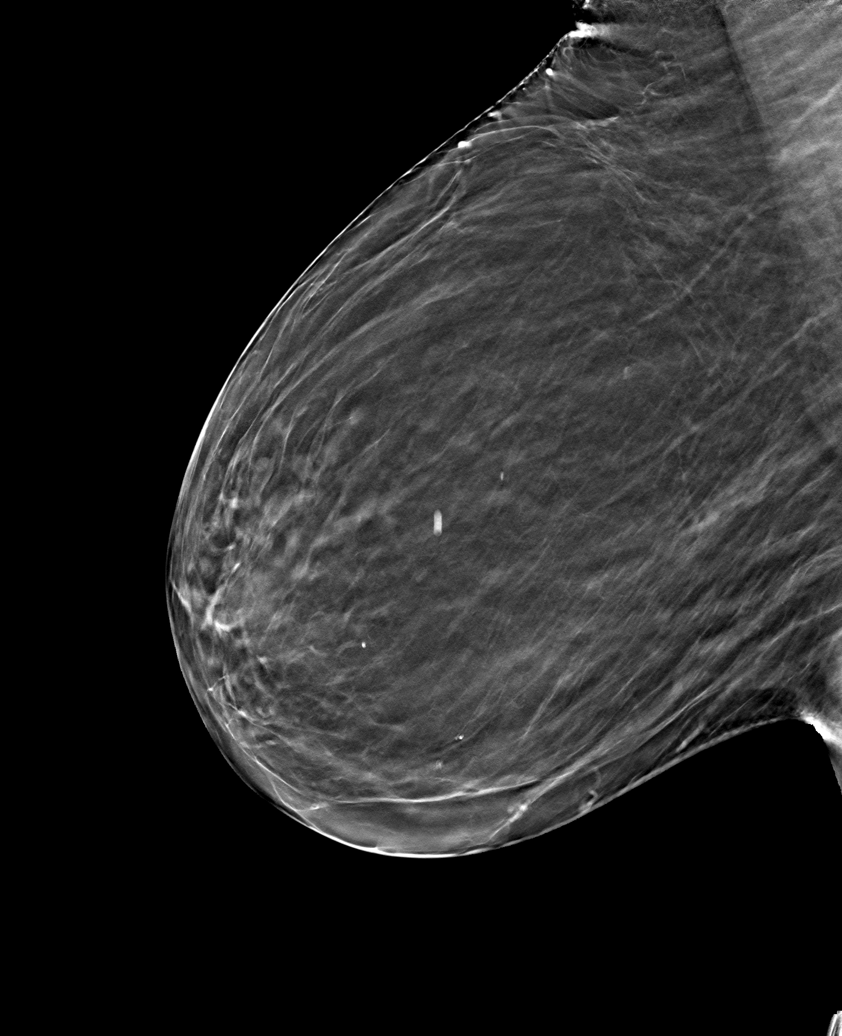

[6 of 30 positions shown; findings below may reference images not displayed]

ACR Breast Density Category b: There are scattered areas of
fibroglandular density.
FINDINGS: There are no findings suspicious for malignancy.
IMPRESSION: No mammographic evidence of malignancy. A result letter of this
screening mammogram will be mailed directly to the patient.

RECOMMENDATION:
Screening mammogram in one year. (Code:51-O-LD2)

BI-RADS CATEGORY  1: Negative.

## 2023-03-20 DIAGNOSIS — E1165 Type 2 diabetes mellitus with hyperglycemia: Secondary | ICD-10-CM | POA: Diagnosis not present

## 2023-03-27 ENCOUNTER — Ambulatory Visit (HOSPITAL_COMMUNITY): Admission: RE | Admit: 2023-03-27 | Payer: 59 | Source: Ambulatory Visit

## 2023-03-28 ENCOUNTER — Encounter: Payer: Self-pay | Admitting: Orthopedic Surgery

## 2023-03-28 ENCOUNTER — Ambulatory Visit: Payer: 59 | Admitting: Orthopedic Surgery

## 2023-03-28 DIAGNOSIS — S83271D Complex tear of lateral meniscus, current injury, right knee, subsequent encounter: Secondary | ICD-10-CM

## 2023-03-28 DIAGNOSIS — Z01818 Encounter for other preprocedural examination: Secondary | ICD-10-CM | POA: Diagnosis not present

## 2023-03-28 NOTE — Progress Notes (Signed)
Orthopaedic Clinic Return  Assessment: Sabrina Thompson is a 68 y.o. female with the following: Right knee pain, with lateral meniscus tear  Plan: Sabrina Thompson has pain in her right knee.  Recent MRI demonstrates degenerative changes throughout the knee, most severe within the patellofemoral compartment.  Also within the lateral compartment is a meniscus tear, which is likely contributing to her mechanical symptoms.  At this point, we discussed proceeding with knee arthroscopy and partial lateral meniscectomy.  Procedure was discussed in detail.  All questions have been answered.  Risks and benefits of the surgery, including, but not limited to infection, bleeding, persistent pain, stiffness, need for further surgery, blood clots and more severe complications associated with anesthesia were discussed with the patient.  The patient has elected to proceed.  Plan for surgery December 07, 2023.  We will confirm clearance with her primary care provider.   Follow-up: Return for After surgery: DOS 04/09/2023.   Subjective:  Chief Complaint  Patient presents with   Knee Pain    R MRI results    History of Present Illness: Sabrina Thompson is a 68 y.o. female who returns to clinic for evaluation of right knee pain.  She has had pain in the right knee for the past couple months.  She continues to walk with a walker.  Pain is better with the brace.  She does note some radiating pains distally, especially at nighttime.  She is currently complaining of a lot of pain in the lateral knee.   Review of Systems: No fevers or chills No numbness or tingling No chest pain No shortness of breath No bowel or bladder dysfunction No GI distress No headaches   Objective: LMP 02/13/2005 (Approximate)   Physical Exam:  Alert and oriented.  No acute distress.  Right sided antalgic gait.  Right knee with a mild effusion.  Tenderness palpation along the lateral joint line.  She is good range of motion.   Toes warm well-perfused.  Negative Lachman.  No increased laxity varus valgus stress.  Pain gets worse with hyperflexion.    IMAGING: I personally ordered and reviewed the following images:  Right knee MRI  IMPRESSION: 1. Horizontal tear of the posterior horn-body junction of lateral meniscus extending to the inferior articular surface. Radial tear of the anterior horn with a flap of meniscal tissue flipped peripherally into the inferior gutter. 2. Tricompartmental cartilage abnormalities as described above most severe in the patellofemoral compartment. 3. Large joint effusion.  Oliver Barre, MD 03/28/2023 11:46 AM

## 2023-03-28 NOTE — H&P (View-Only) (Signed)
 Orthopaedic Clinic Return  Assessment: Sabrina Thompson is a 68 y.o. female with the following: Right knee pain, with lateral meniscus tear  Plan: Sabrina Thompson has pain in her right knee.  Recent MRI demonstrates degenerative changes throughout the knee, most severe within the patellofemoral compartment.  Also within the lateral compartment is a meniscus tear, which is likely contributing to her mechanical symptoms.  At this point, we discussed proceeding with knee arthroscopy and partial lateral meniscectomy.  Procedure was discussed in detail.  All questions have been answered.  Risks and benefits of the surgery, including, but not limited to infection, bleeding, persistent pain, stiffness, need for further surgery, blood clots and more severe complications associated with anesthesia were discussed with the patient.  The patient has elected to proceed.  Plan for surgery December 07, 2023.  We will confirm clearance with her primary care provider.   Follow-up: Return for After surgery: DOS 04/09/2023.   Subjective:  Chief Complaint  Patient presents with   Knee Pain    R MRI results    History of Present Illness: Sabrina Thompson is a 68 y.o. female who returns to clinic for evaluation of right knee pain.  She has had pain in the right knee for the past couple months.  She continues to walk with a walker.  Pain is better with the brace.  She does note some radiating pains distally, especially at nighttime.  She is currently complaining of a lot of pain in the lateral knee.   Review of Systems: No fevers or chills No numbness or tingling No chest pain No shortness of breath No bowel or bladder dysfunction No GI distress No headaches   Objective: LMP 02/13/2005 (Approximate)   Physical Exam:  Alert and oriented.  No acute distress.  Right sided antalgic gait.  Right knee with a mild effusion.  Tenderness palpation along the lateral joint line.  She is good range of motion.   Toes warm well-perfused.  Negative Lachman.  No increased laxity varus valgus stress.  Pain gets worse with hyperflexion.    IMAGING: I personally ordered and reviewed the following images:  Right knee MRI  IMPRESSION: 1. Horizontal tear of the posterior horn-body junction of lateral meniscus extending to the inferior articular surface. Radial tear of the anterior horn with a flap of meniscal tissue flipped peripherally into the inferior gutter. 2. Tricompartmental cartilage abnormalities as described above most severe in the patellofemoral compartment. 3. Large joint effusion.  Oliver Barre, MD 03/28/2023 11:46 AM

## 2023-03-28 NOTE — Patient Instructions (Signed)
Your surgery will be at [facility], scheduled with Dr Thane Edu   The hospital will contact you with a preoperative appointment to discuss Anesthesia. The phone number is [phone]   Please bring your medications with you for the appointment.  They will tell you the arrival time and medication instructions when you have your preoperative evaluation.  Do not wear nail polish the day of your surgery and if you take Phentermine you need to stop this medication ONE WEEK prior to your surgery.    If you take an blood thinning medication, we will need to stop this prior to surgery.  Typically, we stop this medicine at least 5 days prior to surgery.  We will need to confirm this with the doctor who prescribes this medication.  If you are taking medications or an injection for diabetes, or for weight management, this medicine will need to be stopped at least 7 days prior to surgery.     Surgery will be scheduled for 04/09/2023 pending authorization by your insurance company.   Right knee arthroscopy with partial lateral meniscectomy

## 2023-04-02 ENCOUNTER — Telehealth: Payer: Self-pay

## 2023-04-02 NOTE — Telephone Encounter (Signed)
Left vm to reschedule tricia will be out

## 2023-04-03 ENCOUNTER — Other Ambulatory Visit: Payer: 59

## 2023-04-04 ENCOUNTER — Other Ambulatory Visit: Payer: Self-pay

## 2023-04-04 DIAGNOSIS — S83271D Complex tear of lateral meniscus, current injury, right knee, subsequent encounter: Secondary | ICD-10-CM

## 2023-04-05 NOTE — Patient Instructions (Signed)
Sabrina Thompson  04/05/2023     @PREFPERIOPPHARMACY @   Your procedure is scheduled on  04/09/2023.   Report to [facility] at  0600 A.M.    Call this number if you have problems the morning of surgery:  [phone]  If you experience any cold or flu symptoms such as cough, fever, chills, shortness of breath, etc. between now and your scheduled surgery, please notify us at the above number.   Remember:         Take 1/2 of your usual glargine dosage the night before your procedure.         DO NOT take any medications the morning of your procedure.    Do not eat after midnight.    You may drink clear liquids until 0330 am on 04/09/2023.    Clear liquids allowed are:                    Water, Juice (No red color; non-citric and without pulp; diabetics please choose diet or no sugar options), Carbonated beverages (diabetics please choose diet or no sugar options), Clear Tea (No creamer, milk, or cream, including half & half and powdered creamer), Black Coffee Only (No creamer, milk or cream, including half & half and powdered creamer), and Clear Sports drink (No red color; diabetics please choose diet or no sugar options)          At 0330 am on 04/09/2023 drink 1- 12 oz G2. Yo can have nothing else to drink after this.    Take these medicines the morning of surgery with A SIP OF WATER                                fluoxetine, gabapentin.    Do not wear jewelry, make-up or nail polish, including gel polish,  artificial nails, or any other type of covering on natural nails (fingers and  toes).  Do not wear lotions, powders, or perfumes, or deodorant.  Do not shave 48 hours prior to surgery.  Men may shave face and neck.  Do not bring valuables to the hospital.  [facility] is not responsible for any belongings or valuables.  Contacts, dentures or bridgework may not be worn into surgery.  Leave your suitcase in the car.  After surgery it may be brought to your  room.  For patients admitted to the hospital, discharge time will be determined by your treatment team.  Patients discharged the day of surgery will not be allowed to drive home and must have someone with them for 24 hours.    Special instructions:   DO NOT smoke tobacco or vape for 24 hours before your procedure.  Please read over the following fact sheets that you were given. Coughing and Deep Breathing, Surgical Site Infection Prevention, Anesthesia Post-op Instructions, and Care and Recovery After Surgery        Arthroscopic Knee Ligament Repair, Care After After your knee surgery, it's common to have soreness, swelling, or pain. You may also have some fluid coming from the cuts that were made on your knee (incisions). Follow these instructions at home: Medicines Take over-the-counter and prescription medicines only as told by your health care provider. Ask your provider if the medicine prescribed to you: Requires you to avoid driving or using machinery. Can cause constipation. You may need to take these actions to prevent or treat  constipation: Drink enough fluid to keep your pee (urine) pale yellow. Take over-the-counter or prescription medicines. Eat foods that are high in fiber, such as beans, whole grains, and fresh fruits and vegetables. Limit foods that are high in fat and processed sugars, such as fried or sweet foods. If you have a brace: Wear the brace as told by your provider. Remove it only as told by your provider. Check the skin around the brace every day. Tell your provider about any concerns. Loosen the brace if your toes tingle, become numb, or turn cold and blue. Keep it clean and dry. Ask your provider when it's safe to drive if you have a brace on your knee. Bathing Do not take baths, swim, or use a hot tub until your provider approves. Keep your bandage dry until your provider says that it can be removed. If the brace is not waterproof: Do not let it get  wet. Cover it with a watertight covering when you take a bath or shower. Incision care  Follow instructions from your provider about how to take care of your incisions. Make sure you: Wash your hands with soap and water for at least 20 seconds before and after you change your dressing. If soap and water are not available, use hand sanitizer. Change your dressing as told by your provider. Leave stitches, skin glue, or tape strips in place. These skin closures may need to stay in place for 2 weeks or longer. If tape strip edges start to loosen and curl up, you may trim the loose edges. Do not remove tape strips completely unless your provider tells you to do that. Check your incision areas every day for signs of infection. Check for: Redness. More swelling or pain. Blood or more fluid. Warmth. Pus or a bad smell. Managing pain, stiffness, and swelling  If told, put ice on the affected area. If you have a removable brace, remove it as told by your provider. Put ice in a plastic bag. Place a towel between your skin and the bag. Leave the ice on for 20 minutes, 2-3 times a day. If your skin turns bright red, remove the ice right away to prevent skin damage. The risk of damage is higher if you can't feel pain, heat, or cold. Move your toes often to reduce stiffness and swelling. Raise the injured area above the level of your heart while you are sitting or lying down. Activity Do not use your knee to walk until your provider says that you can. Use crutches or other devices as told by your provider. You will work with a physical therapist to do exercises. This will make your knee move better and get stronger. Your provider will tell you: When you may do exercises that move parts of your body. These are called motion exercises. When you may start to ride a stationary bike and other gentle exercises. When you may start to do harder exercises, such as jogging. You may have to avoid lifting. Ask  your provider how much you can safely lift. Return to your normal activities as told by your provider. Ask your provider what activities are safe for you. General instructions Do not use any products that contain nicotine or tobacco. These products include cigarettes, chewing tobacco, and vaping devices, such as e-cigarettes. If you need help quitting, ask your provider. Wear compression stockings as told by your provider. These stockings help to prevent blood clots and reduce swelling in your leg. Keep all follow-up visits. Your  provider will check that your knee is healing well. Contact a health care provider if: You have signs of infection in the cuts that were made on your knee. Signs include swelling, redness, warmth, pus, or a bad smell. You have a fever or chills. You have pain that does not get better with medicine. The cuts in your knee open up. Get help right away if: You have trouble breathing. You have chest pain. You have worse pain or swelling in your calf or at the back of your knee. Your knee, foot, ankle, or toes tingle or become numb. Your foot or toes look darker than normal or are cooler than normal. These symptoms may be an emergency. Get help right away. Call 911. Do not wait to see if the symptoms will go away. Do not drive yourself to the hospital. This information is not intended to replace advice given to you by your health care provider. Make sure you discuss any questions you have with your health care provider. Document Revised: 04/26/2022 Document Reviewed: 04/26/2022 Elsevier Patient Education  2024 Else[employer] Inc.General Anesthesia, Adult, Care After The following information offers guidance on how to care for yourself after your procedure. Your health care provider may also give you more specific instructions. If you have problems or questions, contact your health care provider. What can I expect after the procedure? After the procedure, it is common for  people to: Have pain or discomfort at the IV site. Have nausea or vomiting. Have a sore throat or hoarseness. Have trouble concentrating. Feel cold or chills. Feel weak, sleepy, or tired (fatigue). Have soreness and body aches. These can affect parts of the body that were not involved in surgery. Follow these instructions at home: For the time period you were told by your health care provider:  Rest. Do not participate in activities where you could fall or become injured. Do not drive or use machinery. Do not drink alcohol. Do not take sleeping pills or medicines that cause drowsiness. Do not make important decisions or sign legal documents. Do not take care of children on your own. General instructions Drink enough fluid to keep your urine pale yellow. If you have sleep apnea, surgery and certain medicines can increase your risk for breathing problems. Follow instructions from your health care provider about wearing your sleep device: Anytime you are sleeping, including during daytime naps. While taking prescription pain medicines, sleeping medicines, or medicines that make you drowsy. Return to your normal activities as told by your health care provider. Ask your health care provider what activities are safe for you. Take over-the-counter and prescription medicines only as told by your health care provider. Do not use any products that contain nicotine or tobacco. These products include cigarettes, chewing tobacco, and vaping devices, such as e-cigarettes. These can delay incision healing after surgery. If you need help quitting, ask your health care provider. Contact a health care provider if: You have nausea or vomiting that does not get better with medicine. You vomit every time you eat or drink. You have pain that does not get better with medicine. You cannot urinate or have bloody urine. You develop a skin rash. You have a fever. Get help right away if: You have trouble  breathing. You have chest pain. You vomit blood. These symptoms may be an emergency. Get help right away. Call 911. Do not wait to see if the symptoms will go away. Do not drive yourself to the hospital. Summary After the procedure, it  is common to have a sore throat, hoarseness, nausea, vomiting, or to feel weak, sleepy, or fatigue. For the time period you were told by your health care provider, do not drive or use machinery. Get help right away if you have difficulty breathing, have chest pain, or vomit blood. These symptoms may be an emergency. This information is not intended to replace advice given to you by your health care provider. Make sure you discuss any questions you have with your health care provider. Document Revised: 04/29/2021 Document Reviewed: 04/29/2021 Elsevier Patient Education  2024 Elsevier Inc.How to Use Chlorhexidine at Home in the Shower Chlorhexidine gluconate (CHG) is a germ-killing (antiseptic) wash that's used to clean the skin. It can get rid of the germs that normally live on the skin and can keep them away for about 24 hours. If you're having surgery, you may be told to shower with CHG at home the night before surgery. This can help lower your risk for infection. To use CHG wash in the shower, follow the steps below. Supplies needed: CHG body wash. Clean washcloth. Clean towel. How to use CHG in the shower Follow these steps unless you're told to use CHG in a different way: Start the shower. Use your normal soap and shampoo to wash your face and hair. Turn off the shower or move out of the shower stream. Pour CHG onto a clean washcloth. Do not use any type of brush or rough sponge. Start at your neck, washing your body down to your toes. Make sure you: Wash the part of your body where the surgery will be done for at least 1 minute. Do not scrub. Do not use CHG on your head or face unless your health care provider tells you to. If it gets into your ears  or eyes, rinse them well with water. Do not wash your genitals with CHG. Wash your back and under your arms. Make sure to wash skin folds. Let the CHG sit on your skin for 1-2 minutes or as long as told. Rinse your entire body in the shower, including all body creases and folds. Turn off the shower. Dry off with a clean towel. Do not put anything on your skin afterward, such as powder, lotion, or perfume. Put on clean clothes or pajamas. If it's the night before surgery, sleep in clean sheets. General tips Use CHG only as told, and follow the instructions on the label. Use the full amount of CHG as told. This is often one bottle. Do not smoke and stay away from flames after using CHG. Your skin may feel sticky after using CHG. This is normal. The sticky feeling will go away as the CHG dries. Do not use CHG: If you have a chlorhexidine allergy or have reacted to chlorhexidine in the past. On open wounds or areas of skin that have broken skin, cuts, or scrapes. On babies younger than 51 months of age. Contact a health care provider if: You have questions about using CHG. Your skin gets irritated or itchy. You have a rash after using CHG. You swallow any CHG. Call your local poison control center [phone] in the [place] Your eyes itch badly, or they become very red or swollen. Your hearing changes. You have trouble seeing. If you can't reach your provider, go to an urgent care or emergency room. Do not drive yourself. Get help right away if: You have swelling or tingling in your mouth or throat. You make high-pitched whistling sounds when you breathe,  most often when you breathe out (wheeze). You have trouble breathing. These symptoms may be an emergency. Call 911 right away. Do not wait to see if the symptoms will go away. Do not drive yourself to the hospital. This information is not intended to replace advice given to you by your health care provider. Make sure you discuss any  questions you have with your health care provider. Document Revised: 08/15/2022 Document Reviewed: 08/11/2021 Elsevier Patient Education  2024 [employer].

## 2023-04-06 ENCOUNTER — Encounter (HOSPITAL_COMMUNITY): Payer: Self-pay

## 2023-04-06 ENCOUNTER — Encounter (HOSPITAL_COMMUNITY)
Admission: RE | Admit: 2023-04-06 | Discharge: 2023-04-06 | Disposition: A | Discharge status: Home / Self Care | Payer: 59 | Source: Ambulatory Visit | Attending: Orthopedic Surgery | Admitting: Orthopedic Surgery

## 2023-04-06 DIAGNOSIS — E119 Type 2 diabetes mellitus without complications: Secondary | ICD-10-CM | POA: Diagnosis not present

## 2023-04-06 DIAGNOSIS — Z01812 Encounter for preprocedural laboratory examination: Secondary | ICD-10-CM | POA: Insufficient documentation

## 2023-04-06 DIAGNOSIS — Z01818 Encounter for other preprocedural examination: Secondary | ICD-10-CM

## 2023-04-06 LAB — CBC
HCT: 36.5 % (ref 36.0–46.0)
Hemoglobin: 11.6 g/dL — ABNORMAL LOW (ref 12.0–15.0)
MCH: 29.1 pg (ref 26.0–34.0)
MCHC: 31.8 g/dL (ref 30.0–36.0)
MCV: 91.5 fL (ref 80.0–100.0)
Platelets: 286 10*3/uL (ref 150–400)
RBC: 3.99 MIL/uL (ref 3.87–5.11)
RDW: 12.5 % (ref 11.5–15.5)
WBC: 5.3 10*3/uL (ref 4.0–10.5)
nRBC: 0 % (ref 0.0–0.2)

## 2023-04-06 LAB — BASIC METABOLIC PANEL
Anion gap: 11 (ref 5–15)
BUN: 12 mg/dL (ref 8–23)
CO2: 26 mmol/L (ref 22–32)
Calcium: 9.6 mg/dL (ref 8.9–10.3)
Chloride: 99 mmol/L (ref 98–111)
Creatinine, Ser: 0.75 mg/dL (ref 0.44–1.00)
GFR, Estimated: >60 mL/min (ref >60)
Glucose, Bld: 145 mg/dL — ABNORMAL HIGH (ref 70–99)
Potassium: 4.1 mmol/L (ref 3.5–5.1)
Sodium: 136 mmol/L (ref 135–145)

## 2023-04-06 LAB — HEMOGLOBIN A1C
Hgb A1c MFr Bld: 8 % — ABNORMAL HIGH (ref 4.8–5.6)
Mean Plasma Glucose: 182.9 mg/dL

## 2023-04-09 ENCOUNTER — Ambulatory Visit (HOSPITAL_COMMUNITY): Payer: 59 | Admitting: Anesthesiology

## 2023-04-09 ENCOUNTER — Encounter (HOSPITAL_COMMUNITY)
Admission: RE | Disposition: A | Discharge status: Home / Self Care | Payer: Self-pay | Source: Home / Self Care | Attending: Orthopedic Surgery

## 2023-04-09 ENCOUNTER — Ambulatory Visit (HOSPITAL_BASED_OUTPATIENT_CLINIC_OR_DEPARTMENT_OTHER): Payer: 59 | Admitting: Anesthesiology

## 2023-04-09 ENCOUNTER — Ambulatory Visit (HOSPITAL_COMMUNITY)
Admission: RE | Admit: 2023-04-09 | Discharge: 2023-04-09 | Disposition: A | Discharge status: Home / Self Care | Payer: 59 | Attending: Orthopedic Surgery | Admitting: Orthopedic Surgery

## 2023-04-09 DIAGNOSIS — Z7984 Long term (current) use of oral hypoglycemic drugs: Secondary | ICD-10-CM | POA: Diagnosis not present

## 2023-04-09 DIAGNOSIS — S83281A Other tear of lateral meniscus, current injury, right knee, initial encounter: Secondary | ICD-10-CM

## 2023-04-09 DIAGNOSIS — M6751 Plica syndrome, right knee: Secondary | ICD-10-CM | POA: Diagnosis not present

## 2023-04-09 DIAGNOSIS — E119 Type 2 diabetes mellitus without complications: Secondary | ICD-10-CM | POA: Diagnosis not present

## 2023-04-09 DIAGNOSIS — S83271A Complex tear of lateral meniscus, current injury, right knee, initial encounter: Secondary | ICD-10-CM | POA: Diagnosis not present

## 2023-04-09 DIAGNOSIS — I10 Essential (primary) hypertension: Secondary | ICD-10-CM | POA: Insufficient documentation

## 2023-04-09 DIAGNOSIS — Z87891 Personal history of nicotine dependence: Secondary | ICD-10-CM

## 2023-04-09 DIAGNOSIS — X58XXXA Exposure to other specified factors, initial encounter: Secondary | ICD-10-CM | POA: Insufficient documentation

## 2023-04-09 DIAGNOSIS — Z794 Long term (current) use of insulin: Secondary | ICD-10-CM | POA: Diagnosis not present

## 2023-04-09 DIAGNOSIS — M659 Unspecified synovitis and tenosynovitis, unspecified site: Secondary | ICD-10-CM | POA: Insufficient documentation

## 2023-04-09 HISTORY — PX: KNEE ARTHROSCOPY: SHX127

## 2023-04-09 LAB — GLUCOSE, CAPILLARY
Glucose-Capillary: 332 mg/dL — ABNORMAL HIGH (ref 70–99)
Glucose-Capillary: 289 mg/dL — ABNORMAL HIGH (ref 70–99)
Glucose-Capillary: 246 mg/dL — ABNORMAL HIGH (ref 70–99)

## 2023-04-09 SURGERY — ARTHROSCOPY, KNEE
Anesthesia: General | Site: Knee | Laterality: Right

## 2023-04-09 MED ORDER — BUPIVACAINE-EPINEPHRINE (PF) 0.5% -1:200000 IJ SOLN
INTRAMUSCULAR | Status: DC | PRN
  Administered 2023-04-09: 30 mL via PERINEURAL

## 2023-04-09 MED ORDER — BUPIVACAINE-EPINEPHRINE (PF) 0.5% -1:200000 IJ SOLN
INTRAMUSCULAR | Status: AC
  Filled 2023-04-09: qty 30

## 2023-04-09 MED ORDER — PROPOFOL 10 MG/ML IV BOLUS
INTRAVENOUS | Status: DC | PRN
  Administered 2023-04-09: 120 mg via INTRAVENOUS

## 2023-04-09 MED ORDER — EPINEPHRINE PF 1 MG/ML IJ SOLN
INTRAMUSCULAR | Status: AC
  Filled 2023-04-09: qty 8

## 2023-04-09 MED ORDER — FENTANYL CITRATE (PF) 100 MCG/2ML IJ SOLN
INTRAMUSCULAR | Status: AC
  Filled 2023-04-09: qty 2

## 2023-04-09 MED ORDER — LACTATED RINGERS IV SOLN: INTRAVENOUS | Status: DC

## 2023-04-09 MED ORDER — INSULIN ASPART 100 UNIT/ML IJ SOLN
6.0000 [IU] | Freq: Once | INTRAMUSCULAR | Status: AC
  Administered 2023-04-09: 6 [IU] via SUBCUTANEOUS
  Filled 2023-04-09: qty 0.06

## 2023-04-09 MED ORDER — LIDOCAINE HCL (CARDIAC) PF 100 MG/5ML IV SOSY
PREFILLED_SYRINGE | INTRAVENOUS | Status: DC | PRN
  Administered 2023-04-09: 80 mg via INTRAVENOUS

## 2023-04-09 MED ORDER — OXYCODONE HCL 5 MG PO TABS
5.0000 mg | ORAL_TABLET | Freq: Once | ORAL | Status: AC | PRN
  Administered 2023-04-09: 5 mg via ORAL
  Filled 2023-04-09: qty 1

## 2023-04-09 MED ORDER — ONDANSETRON HCL 4 MG/2ML IJ SOLN
INTRAMUSCULAR | Status: DC | PRN
  Administered 2023-04-09: 4 mg via INTRAVENOUS

## 2023-04-09 MED ORDER — FENTANYL CITRATE PF 50 MCG/ML IJ SOSY: 25.0000 ug | PREFILLED_SYRINGE | INTRAMUSCULAR | Status: DC | PRN

## 2023-04-09 MED ORDER — PROPOFOL 10 MG/ML IV BOLUS
INTRAVENOUS | Status: AC
  Filled 2023-04-09: qty 20

## 2023-04-09 MED ORDER — ASPIRIN 81 MG PO TBEC: 81.0000 mg | DELAYED_RELEASE_TABLET | Freq: Two times a day (BID) | ORAL | 0 refills | Status: AC

## 2023-04-09 MED ORDER — GLYCOPYRROLATE PF 0.2 MG/ML IJ SOSY
PREFILLED_SYRINGE | INTRAMUSCULAR | Status: DC | PRN
  Administered 2023-04-09: .2 mg via INTRAVENOUS

## 2023-04-09 MED ORDER — ORAL CARE MOUTH RINSE: 15.0000 mL | Freq: Once | OROMUCOSAL | Status: AC

## 2023-04-09 MED ORDER — CELECOXIB 100 MG PO CAPS: 100.0000 mg | ORAL_CAPSULE | Freq: Every day | ORAL | 0 refills | Status: AC

## 2023-04-09 MED ORDER — LIDOCAINE HCL (PF) 2 % IJ SOLN
INTRAMUSCULAR | Status: AC
Start: 2023-04-09 — End: ?
  Filled 2023-04-09: qty 5

## 2023-04-09 MED ORDER — ONDANSETRON HCL 4 MG/2ML IJ SOLN
INTRAMUSCULAR | Status: AC
  Filled 2023-04-09: qty 2

## 2023-04-09 MED ORDER — MIDAZOLAM HCL 5 MG/5ML IJ SOLN
INTRAMUSCULAR | Status: DC | PRN
  Administered 2023-04-09: 2 mg via INTRAVENOUS

## 2023-04-09 MED ORDER — MIDAZOLAM HCL 2 MG/2ML IJ SOLN
INTRAMUSCULAR | Status: AC
  Filled 2023-04-09: qty 2

## 2023-04-09 MED ORDER — CEFAZOLIN SODIUM-DEXTROSE 2-4 GM/100ML-% IV SOLN
2.0000 g | INTRAVENOUS | Status: AC
  Administered 2023-04-09: 2 g via INTRAVENOUS

## 2023-04-09 MED ORDER — SODIUM CHLORIDE 0.9 % IR SOLN
Status: DC | PRN
  Administered 2023-04-09: 1000 mL

## 2023-04-09 MED ORDER — GLYCOPYRROLATE PF 0.2 MG/ML IJ SOSY
PREFILLED_SYRINGE | INTRAMUSCULAR | Status: AC
  Filled 2023-04-09: qty 1

## 2023-04-09 MED ORDER — ONDANSETRON HCL 4 MG/2ML IJ SOLN: 4.0000 mg | Freq: Once | INTRAMUSCULAR | Status: DC | PRN

## 2023-04-09 MED ORDER — FENTANYL CITRATE (PF) 100 MCG/2ML IJ SOLN
INTRAMUSCULAR | Status: DC | PRN
  Administered 2023-04-09 (×2): 25 ug via INTRAVENOUS
  Administered 2023-04-09: 50 ug via INTRAVENOUS
  Administered 2023-04-09: 25 ug via INTRAVENOUS

## 2023-04-09 MED ORDER — HYDROCODONE-ACETAMINOPHEN 7.5-325 MG PO TABS: 1.0000 | ORAL_TABLET | Freq: Four times a day (QID) | ORAL | 0 refills | Status: AC | PRN

## 2023-04-09 MED ORDER — OXYCODONE HCL 5 MG/5ML PO SOLN: 5.0000 mg | Freq: Once | ORAL | Status: AC | PRN

## 2023-04-09 MED ORDER — SODIUM CHLORIDE 0.9 % IR SOLN
Status: DC | PRN
  Administered 2023-04-09: 3000 mL

## 2023-04-09 MED ORDER — ONDANSETRON HCL 4 MG PO TABS: 4.0000 mg | ORAL_TABLET | Freq: Three times a day (TID) | ORAL | 0 refills | Status: AC | PRN

## 2023-04-09 MED ORDER — CEFAZOLIN SODIUM-DEXTROSE 2-4 GM/100ML-% IV SOLN
INTRAVENOUS | Status: AC
  Filled 2023-04-09: qty 100

## 2023-04-09 MED ORDER — CHLORHEXIDINE GLUCONATE 0.12 % MT SOLN
15.0000 mL | Freq: Once | OROMUCOSAL | Status: AC
  Administered 2023-04-09: 15 mL via OROMUCOSAL
  Filled 2023-04-09: qty 15

## 2023-04-09 SURGICAL SUPPLY — 39 items
BANDAGE ESMARK 6X9 LF (GAUZE/BANDAGES/DRESSINGS) ×1 IMPLANT
BNDG ELASTIC 6X5.8 VLCR NS LF (GAUZE/BANDAGES/DRESSINGS) ×1 IMPLANT
BNDG ESMARK 6X9 LF (GAUZE/BANDAGES/DRESSINGS) ×1
CHLORAPREP W/TINT 26 (MISCELLANEOUS) ×1 IMPLANT
CLOTH BEACON ORANGE TIMEOUT ST (SAFETY) ×1 IMPLANT
COOLER ICEMAN CLASSIC (MISCELLANEOUS) ×1 IMPLANT
COUNTER NDL MAGNETIC 40 RED (SET/KITS/TRAYS/PACK) ×1 IMPLANT
COUNTER NEEDLE MAGNETIC 40 RED (SET/KITS/TRAYS/PACK) ×1 IMPLANT
CUFF TRNQT CYL 34X4.125X (TOURNIQUET CUFF) ×2 IMPLANT
CUTTER BONE 4.0MM X 13CM (MISCELLANEOUS) IMPLANT
GAUZE SPONGE 4X4 12PLY STRL (GAUZE/BANDAGES/DRESSINGS) IMPLANT
GAUZE XEROFORM 1X8 LF (GAUZE/BANDAGES/DRESSINGS) IMPLANT
GLOVE BIO SURGEON STRL SZ8 (GLOVE) ×3 IMPLANT
GLOVE BIOGEL PI IND STRL 7.0 (GLOVE) ×2 IMPLANT
GLOVE BIOGEL PI IND STRL 8 (GLOVE) ×1 IMPLANT
GOWN STRL REUS W/TWL LRG LVL3 (GOWN DISPOSABLE) ×1 IMPLANT
GOWN STRL REUS W/TWL XL LVL3 (GOWN DISPOSABLE) ×2 IMPLANT
KIT TURNOVER CYSTO (KITS) ×1 IMPLANT
MANIFOLD NEPTUNE II (INSTRUMENTS) ×1 IMPLANT
MARKER SKIN DUAL TIP RULER LAB (MISCELLANEOUS) ×1 IMPLANT
NDL HYPO 18GX1.5 BLUNT FILL (NEEDLE) ×1 IMPLANT
NDL HYPO 21X1.5 SAFETY (NEEDLE) ×1 IMPLANT
NEEDLE HYPO 18GX1.5 BLUNT FILL (NEEDLE) ×1 IMPLANT
NEEDLE HYPO 21X1.5 SAFETY (NEEDLE) ×1 IMPLANT
NS IRRIG 1000ML POUR BTL (IV SOLUTION) IMPLANT
PACK ARTHRO LIMB DRAPE STRL (MISCELLANEOUS) ×1 IMPLANT
PAD ABD 5X9 TENDERSORB (GAUZE/BANDAGES/DRESSINGS) ×1 IMPLANT
PAD ARMBOARD 7.5X6 YLW CONV (MISCELLANEOUS) ×1 IMPLANT
PAD COLD SHLDR SM WRAP-ON (PAD) IMPLANT
PADDING CAST COTTON 6X4 STRL (CAST SUPPLIES) ×1 IMPLANT
PORT APPOLLO RF 90DEGREE MULTI (SURGICAL WAND) IMPLANT
SET ARTHROSCOPY INST (INSTRUMENTS) ×1 IMPLANT
SET BASIN LINEN APH (SET/KITS/TRAYS/PACK) ×1 IMPLANT
SOL .9 NS 3000ML IRR UROMATIC (IV SOLUTION) ×2 IMPLANT
SUT 3-0 BLK 1X30 PSL (SUTURE) ×1 IMPLANT
SYR 30ML LL (SYRINGE) ×1 IMPLANT
SYR CONTROL 10ML LL (SYRINGE) ×1 IMPLANT
TUBE CONNECTING 12X1/4 (SUCTIONS) ×1 IMPLANT
TUBING IN/OUT FLOW W/MAIN PUMP (TUBING) ×1 IMPLANT

## 2023-04-09 NOTE — Transfer of Care (Signed)
 Immediate Anesthesia Transfer of Care Note  Patient: Sabrina Thompson  Procedure(s) Performed: ARTHROSCOPY KNEE WITH PARTIAL LATERAL MENISCECTOMY (Right: Knee)  Patient Location: PACU  Anesthesia Type:General  Level of Consciousness: sedated  Airway & Oxygen Therapy: Patient Spontanous Breathing and Patient connected to nasal cannula oxygen  Post-op Assessment: Report given to RN and Post -op Vital signs reviewed and stable  Post vital signs: Reviewed and stable  Last Vitals:  Vitals Value Taken Time  BP 96/48 04/09/23 0845  Temp 97.5   Pulse 93 04/09/23 0849  Resp 12 04/09/23 0849  SpO2 100 % 04/09/23 0849  Vitals shown include unfiled device data.  Last Pain:  Vitals:   04/09/23 0647  PainSc: 0-No pain         Complications: No notable events documented.

## 2023-04-09 NOTE — Anesthesia Preprocedure Evaluation (Signed)
 Anesthesia Evaluation  Patient identified by MRN, date of birth, ID band Patient awake    Reviewed: Allergy & Precautions, H&P , NPO status , Patient's Chart, lab work & pertinent test results, reviewed documented beta blocker date and time   Airway Mallampati: II  TM Distance: >3 FB Neck ROM: full    Dental  (+) Dental Advisory Given, Missing Several missing upper teeth but denies any loose:   Pulmonary former smoker   Pulmonary exam normal breath sounds clear to auscultation (-) decreased breath sounds      Cardiovascular Exercise Tolerance: Good hypertension, Normal cardiovascular exam Rhythm:regular Rate:Normal     Neuro/Psych  PSYCHIATRIC DISORDERS Anxiety Depression     Neuromuscular disease    GI/Hepatic negative GI ROS, Neg liver ROS,,,  Endo/Other  diabetes    Renal/GU   negative genitourinary   Musculoskeletal  (+) Arthritis , Osteoarthritis,    Abdominal Normal abdominal exam  (+)   Peds  Hematology  (+) Blood dyscrasia   Anesthesia Other Findings   Reproductive/Obstetrics negative OB ROS                             Anesthesia Physical Anesthesia Plan  ASA: 3  Anesthesia Plan: General   Post-op Pain Management: Dilaudid IV   Induction: Intravenous  PONV Risk Score and Plan: Ondansetron, Dexamethasone and Midazolam  Airway Management Planned: LMA  Additional Equipment: None  Intra-op Plan:   Post-operative Plan:   Informed Consent: I have reviewed the patients History and Physical, chart, labs and discussed the procedure including the risks, benefits and alternatives for the proposed anesthesia with the patient or authorized representative who has indicated his/her understanding and acceptance.     Dental Advisory Given  Plan Discussed with: CRNA  Anesthesia Plan Comments:        Anesthesia Quick Evaluation

## 2023-04-09 NOTE — Discharge Instructions (Signed)
 Sabrina Thompson A. Dallas Schimke, MD MS [facility] [address] [place],  [place]  [address] Phone: [phone] Fax:  [phone]    POST-OPERATIVE INSTRUCTIONS - Knee Arthroscopy  WOUND CARE - You may remove the Operative Dressing on Post-Op Day #3 (72hrs after surgery).  Alternatively if you would like you can leave dressing on until follow-up if within 7-8 days but keep it dry. - Leave steri-strips in place until they fall off on their own, usually 2 weeks postop. - An ACE wrap may be used to control swelling, do not wrap this too tight.  If the initial ACE wrap feels too tight you may loosen it. - There may be a small amount of fluid/bleeding leaking at the surgical site. This is normal; the knee is filled with fluid during the procedure and can leak for 24-48hrs after surgery. You may change/reinforce the bandage as needed.  - Use the Cryocuff or Ice as often as possible for the first 7 days, then as needed for pain relief. Always keep a towel, ACE wrap or other barrier between the cooling unit and your skin.  - You may shower on Post-Op Day #3. Gently pat the area dry. Do not soak the knee in water or submerge it. Do not go swimming in the pool or ocean until 4 weeks after surgery or when otherwise instructed.  Keep dry incisions as dry as possible.   BRACE/AMBULATION -  You can use your brace until your nerve block wears off.  At that time you can remove it. -            Unless otherwise specified, you will not need a brace after this procedure.    REGIONAL ANESTHESIA (NERVE BLOCKS) - The anesthesia team may have performed a nerve block for you if safe in the setting of your care.  This is a great tool used to minimize pain.  Typically the block may start wearing off overnight.  This can be a challenging period but please utilize your as needed pain medications to try and manage this period and know it will be a brief transition as the nerve block wears completely    POST-OP MEDICATIONS - Multimodal approach to pain control - In general your pain will be controlled with a combination of substances.  Prescriptions unless otherwise discussed are electronically sent to your pharmacy.  This is a carefully made plan we use to minimize narcotic use.    - Celebrex - Anti-inflammatory medication taken on a scheduled basis - Hydrocodone - This is a strong narcotic, to be used only on an "as needed" basis for pain. - Aspirin 81mg  - This medicine is used to minimize the risk of blood clots after surgery. -  Zofran - take as needed for nausea   FOLLOW-UP   Please call the office to schedule a follow-up appointment for your incision check, 7-10 days post-operatively.   IF YOU HAVE ANY QUESTIONS, PLEASE FEEL FREE TO CALL OUR OFFICE.   HELPFUL INFORMATION  - If you had a block, it will wear off between 8-24 hrs postop typically.  This is period when your pain may go from nearly zero to the pain you would have had post-op without the block.  This is an abrupt transition but nothing dangerous is happening.  You may take an extra dose of narcotic when this happens.   Keep your leg elevated to decrease swelling, which will then in turn decrease your pain. I would elevate  the foot of your bed by putting a couple of couch pillows between your mattress and box spring. I would not keep pillow directly under your ankle.  - Do not sleep with a pillow behind your knee even if it is more comfortable as this may make it harder to get your knee fully straight long term.   There will be MORE swelling on days 1-3 than there is on the day of surgery.  This also is normal. The swelling will decrease with the anti-inflammatory medication, ice and keeping it elevated. The swelling will make it more difficult to bend your knee. As the swelling goes down your motion will become easier   You may develop swelling and bruising that extends from your knee down to your calf and perhaps  even to your foot over the next week. Do not be alarmed. This too is normal, and it is due to gravity   There may be some numbness adjacent to the incision site. This may last for 6-12 months or longer in some patients and is expected.   You may return to sedentary work/school in the next couple of days when you feel up to it. You will need to keep your leg elevated as much as possible    You should wean off your narcotic medicines as soon as you are able.  Most patients will be off or using minimal narcotics before their first postop appointment.    We suggest you use the pain medication the first night prior to going to bed, in order to ease any pain when the anesthesia wears off. You should avoid taking pain medications on an empty stomach as it will make you nauseous.   Do not drink alcoholic beverages or take illicit drugs when taking pain medications.   It is against the law to drive while taking narcotics. You cannot drive if your Right leg is in brace locked in extension.   Pain medication may make you constipated.  Below are a few solutions to try in this order:  o Decrease the amount of pain medication if you aren't having pain.  o Drink lots of decaffeinated fluids.  o Drink prune juice and/or each dried prunes   o If the first 3 don't work start with additional solutions  o Take Colace - an over-the-counter stool softener  o Take Senokot - an over-the-counter laxative  o Take Miralax - a stronger over-the-counter laxative

## 2023-04-09 NOTE — Interval H&P Note (Signed)
 History and Physical Interval Note:  04/09/2023 7:19 AM  Sabrina Thompson  has presented today for surgery, with the diagnosis of Right knee lateral meniscus tear.  The various methods of treatment have been discussed with the patient and family. After consideration of risks, benefits and other options for treatment, the patient has consented to  Procedure(s) with comments: ARTHROSCOPY KNEE (Right) - Right knee arthroscopy with partial lateral meniscectomy. as a surgical intervention.  The patient's history has been reviewed, patient examined, no change in status, stable for surgery.  I have reviewed the patient's chart and labs.  Questions were answered to the patient's satisfaction.     Oliver Barre

## 2023-04-09 NOTE — Anesthesia Procedure Notes (Signed)
 Procedure Name: LMA Insertion Date/Time: 04/09/2023 7:36 AM  Performed by: Jeanette Caprice, CRNAPre-anesthesia Checklist: Patient identified, Emergency Drugs available, Suction available, Patient being monitored and Timeout performed Patient Re-evaluated:Patient Re-evaluated prior to induction Oxygen Delivery Method: Circle system utilized Preoxygenation: Pre-oxygenation with 100% oxygen Induction Type: IV induction LMA: LMA inserted LMA Size: 4.0 Tube type: Oral Tube secured with: Tape Dental Injury: Teeth and Oropharynx as per pre-operative assessment

## 2023-04-09 NOTE — Op Note (Signed)
 Orthopaedic Surgery Operative Note (CSN: [id number])  Sabrina Thompson  Apr 12, 1955 Date of Surgery: 04/09/2023   Diagnoses:  Right knee lateral meniscus tear Right knee lateral plica  Procedure: Right knee arthroscopy, with partial lateral meniscectomy and debridement of plica   Operative Finding Exam under anesthesia: Full range of motion.  Slight valgus alignment.  Negative Lachman.  No increased laxity to varus or valgus stress. Suprapatellar pouch: Minimal synovitis.  No loose bodies Patellofemoral Compartment: Grade 3/4 changes in both the trochlea, as well as the patella Medial Compartment: Grade 3 changes on the medial femoral condyle.  No medial meniscus injury Lateral Compartment: Areas of grade 4 changes on both the lateral femoral condyle, as well as the lateral tibial plateau.  Presence of a plica causing abrasion to the lateral aspect of the lateral femoral condyle Intercondylar Notch: ACL PCL intact  Successful completion of the planned procedure.     Post-Op Diagnosis: Same Surgeons:Primary: Oliver Barre, MD Location: [facility] OR ROOM 4 Anesthesia: General with local anesthetic Antibiotics: Ancef 2 g Tourniquet time:  Total Tourniquet Time Documented: Thigh (Right) - 40 minutes Total: Thigh (Right) - 40 minutes  Estimated Blood Loss: Minimal Complications: None Specimens: None.  Implants: None  Indications for Surgery:   Sabrina Thompson is a 68 y.o. female with persistent right knee pain.  She is attempted multiple nonoperative measures including medications, therapy, bracing, protected weightbearing as well as injections, with limited sustained improvement.  MRI demonstrated lateral meniscus injury, with a large tear extending into the lateral gutter.  After reviewing the imaging, we did discuss proceeding with right knee arthroscopy.  She is aware that she has some degenerative changes throughout the knee.  After discussion of all the risks and benefits she did want  to proceed with right knee arthroscopy.  Benefits and risks of operative and nonoperative management were discussed prior to surgery with the patient and informed consent form was completed.  Specific risks including infection, need for additional surgery, bleeding, persistent pain, progression of arthritis, stiffness, blood clots and more severe complications associated with anesthesia were discussed.  All questions have been answered.  Surgical consent was finalized.   Procedure:   The patient was identified properly. Informed consent was obtained and the surgical site was marked. The patient was taken up to suite where general anesthesia was induced. The patient was placed in the supine position with a post against the surgical leg and a nonsterile tourniquet applied. The surgical leg was then prepped and draped usual sterile fashion.  A standard surgical timeout was performed.  2 standard anterior portals were made and diagnostic arthroscopy performed. Please note the findings as noted above.  We did the shaver to debride the ligamentum mucosum.  Fat pad was debrided to improve visibility.  There were grade 3/4 cartilage changes within all 3 compartments.  Most advanced degenerative changes within the lateral compartment.  Within the medial compartment, there were grade 3 changes on the medial femoral condyle.  No meniscus injury.  We then evaluated the lateral compartment, and noted a tear of the posterior horn at the junction of the posterior horn and the body.  This was debrided back to a stable rim.  There is also a large undersurface tear of the body of the meniscus, extending to the anterior horn, which had flipped into the gutter.  This was debrided back to a stable rim with a combination of arthroscopic basket, as well as shaver.  At least 50% of the  body was debrided.  Also at the attachment of the anterior horn, there was a tear which was debrided back to a stable base.  We then evaluated the  lateral compartment, noted that there was a plica overlying the lateral femoral condyle, which had caused complete abrasion of cartilage over the lateral edge of the lateral femoral condyle.  This plica was debrided with a combination of shaver and arthroscopic cautery.  The rest of the knee was evaluated once again.  Nothing further was needed.  We used the electrocautery to ensure that there was no bleeding.  Instruments and the camera were removed.  Incisions closed with 3-0 nylon.  Marcaine was injected around the portals.  The patient was awoken from general anesthesia and taken to the PACU in stable condition without complication.    Post-operative plan:  The patient will be discharged from the PACU when she is sufficiently recovered. The patient will be weightbearing as tolerated on the right lower extremity.  Range of motion as tolerated. DVT prophylaxis Aspirin 81 mg twice daily for 6 weeks, or until she is fully ambulatory Pain control with PRN pain medication preferring oral medicines.   Follow up plan will be scheduled in approximately 10-14 days for incision check and suture removal

## 2023-04-09 NOTE — Progress Notes (Signed)
 Instructed on incentive spirometer. 2000 ml obtained. Tolerated well.

## 2023-04-09 NOTE — Anesthesia Postprocedure Evaluation (Signed)
 Anesthesia Post Note  Patient: Sabrina Thompson  Procedure(s) Performed: ARTHROSCOPY KNEE WITH PARTIAL LATERAL MENISCECTOMY (Right: Knee)  Patient location during evaluation: PACU Anesthesia Type: General Level of consciousness: awake and alert Pain management: pain level controlled Vital Signs Assessment: post-procedure vital signs reviewed and stable Respiratory status: spontaneous breathing, nonlabored ventilation, respiratory function stable and patient connected to nasal cannula oxygen Cardiovascular status: blood pressure returned to baseline and stable Postop Assessment: no apparent nausea or vomiting Anesthetic complications: no   There were no known notable events for this encounter.   Last Vitals:  Vitals:   04/09/23 0949 04/09/23 0952  BP: (!) 173/88 (!) 165/82  Pulse: 87 82  Resp: 20 20  Temp: 36.5 C   SpO2: 97% 100%    Last Pain:  Vitals:   04/09/23 0952  TempSrc:   PainSc: 1                  Isolde Skaff L Chauncey Bruno

## 2023-04-10 ENCOUNTER — Encounter (HOSPITAL_COMMUNITY): Payer: Self-pay | Admitting: Orthopedic Surgery

## 2023-04-10 ENCOUNTER — Encounter: Payer: Self-pay | Admitting: Orthopedic Surgery

## 2023-04-11 ENCOUNTER — Telehealth: Payer: Self-pay | Admitting: Orthopedic Surgery

## 2023-04-11 NOTE — Telephone Encounter (Signed)
 Spoke w/ pt and went over discharge paperwork. All questions were answered and no further questions at this time

## 2023-04-11 NOTE — Telephone Encounter (Signed)
 Dr. Dallas Schimke pt - spoke w/the pt, she stated she is supposed to stop using something tomorrow when she showers and she can't remember what it is.  She would like a call back.  [phone].

## 2023-04-13 ENCOUNTER — Encounter (HOSPITAL_COMMUNITY): Payer: Self-pay

## 2023-04-13 ENCOUNTER — Ambulatory Visit (HOSPITAL_COMMUNITY): Payer: 59

## 2023-04-17 DIAGNOSIS — E1165 Type 2 diabetes mellitus with hyperglycemia: Secondary | ICD-10-CM | POA: Diagnosis not present

## 2023-04-18 ENCOUNTER — Ambulatory Visit (INDEPENDENT_AMBULATORY_CARE_PROVIDER_SITE_OTHER): Payer: 59 | Admitting: Orthopedic Surgery

## 2023-04-18 ENCOUNTER — Encounter: Payer: Self-pay | Admitting: Orthopedic Surgery

## 2023-04-18 DIAGNOSIS — S83271D Complex tear of lateral meniscus, current injury, right knee, subsequent encounter: Secondary | ICD-10-CM

## 2023-04-18 NOTE — Patient Instructions (Signed)

## 2023-04-18 NOTE — Progress Notes (Signed)
 Orthopaedic Postop Note  Assessment: Sabrina Thompson is a 68 y.o. female s/p right knee arthroscopy, with partial lateral meniscectomy  DOS: 04/09/2023  Plan: Sutures removed, Steri-Strips placed Arthroscopic pictures reviewed Procedure discussed Range of motion as tolerated Weightbearing as tolerated Activities as tolerated Protected weightbearing, with transition to a cane. Medications as needed Consider physical therapy Follow-up in 1 month    Follow-up: Return in about 4 weeks (around 05/16/2023). XR at next visit: None  Subjective:  Chief Complaint  Patient presents with   Routine Post Op    R knee DOS 04/09/23    History of Present Illness: Sabrina Thompson is a 68 y.o. female who presents following the above stated procedure.  Surgery was a little over a week ago.  She is doing well overall.  She stopped taking oxycodone.  She notes some constipation, which is resolving.  She has been using the ice machine.  She is using a walker to assist with ambulation.  Review of Systems: No fevers or chills No numbness or tingling No Chest Pain No shortness of breath + Constipation   Objective: LMP 02/13/2005 (Approximate)   Physical Exam:  Alert and oriented.  No acute distress  Ambulating with the assistance of a walker  Right knee with some healing bruising.  Mild effusion.  Range of motion from 10-100 degrees.  Diffuse tenderness to palpation.  IMAGING: I personally ordered and reviewed the following images:  No new imaging obtained today  Oliver Barre, MD 04/18/2023 9:13 AM

## 2023-05-03 ENCOUNTER — Telehealth: Payer: Self-pay | Admitting: Orthopedic Surgery

## 2023-05-03 DIAGNOSIS — S83271D Complex tear of lateral meniscus, current injury, right knee, subsequent encounter: Secondary | ICD-10-CM

## 2023-05-03 DIAGNOSIS — M25361 Other instability, right knee: Secondary | ICD-10-CM

## 2023-05-03 NOTE — Telephone Encounter (Signed)
 DR. Dallas Schimke   Patient called lvm she needs Dr. Dallas Schimke to schedule PT for her.  What she is doing is not working it's tighting up like he said it would.    Leta please call me back at [phone]

## 2023-05-04 NOTE — Telephone Encounter (Signed)
 Spoke w/ pt after ordering PT, pt states he can't get an appointment until mid April. Asked if she would like to see if alternate facility can get her in sooner pt would like to also try this option as well. Will fax to [facility] to see if they will be able to assist sooner.

## 2023-05-07 ENCOUNTER — Other Ambulatory Visit: Payer: 59

## 2023-05-11 DIAGNOSIS — M6281 Muscle weakness (generalized): Secondary | ICD-10-CM | POA: Diagnosis not present

## 2023-05-11 DIAGNOSIS — R2689 Other abnormalities of gait and mobility: Secondary | ICD-10-CM | POA: Diagnosis not present

## 2023-05-11 DIAGNOSIS — M25561 Pain in right knee: Secondary | ICD-10-CM | POA: Diagnosis not present

## 2023-05-11 DIAGNOSIS — M25661 Stiffness of right knee, not elsewhere classified: Secondary | ICD-10-CM | POA: Diagnosis not present

## 2023-05-16 ENCOUNTER — Ambulatory Visit (INDEPENDENT_AMBULATORY_CARE_PROVIDER_SITE_OTHER): Admitting: Orthopedic Surgery

## 2023-05-16 ENCOUNTER — Encounter: Payer: Self-pay | Admitting: Orthopedic Surgery

## 2023-05-16 DIAGNOSIS — S83271D Complex tear of lateral meniscus, current injury, right knee, subsequent encounter: Secondary | ICD-10-CM

## 2023-05-16 DIAGNOSIS — R2689 Other abnormalities of gait and mobility: Secondary | ICD-10-CM | POA: Diagnosis not present

## 2023-05-16 DIAGNOSIS — M6281 Muscle weakness (generalized): Secondary | ICD-10-CM | POA: Diagnosis not present

## 2023-05-16 DIAGNOSIS — M25561 Pain in right knee: Secondary | ICD-10-CM | POA: Diagnosis not present

## 2023-05-16 DIAGNOSIS — M25661 Stiffness of right knee, not elsewhere classified: Secondary | ICD-10-CM | POA: Diagnosis not present

## 2023-05-16 NOTE — Progress Notes (Signed)
 Orthopaedic Postop Note  Assessment: Sabrina Thompson is a 68 y.o. female s/p right knee arthroscopy, with partial lateral meniscectomy  DOS: 04/09/2023  Plan: Sabrina Thompson is progressing.  She does have occasional pain and swelling.  Her incisions look good.  Range of motion is improving.  Continue working with physical therapy.  Walking is excellent exercise.  Okay to continue to have a cane or walking stick, although if she resented the clinic today without assistive device.  She will follow-up in 6 weeks for repeat evaluation.    Follow-up: Return in about 6 weeks (around 06/27/2023). XR at next visit: None  Subjective:  Chief Complaint  Patient presents with   Routine Post Op    R knee DOS: 04/09/2023, Has been going to [facility] for therapy    History of Present Illness: Sabrina Thompson is a 68 y.o. female who presents following the above stated procedure.  Surgery was approximately 6 weeks ago.  She is improving.  Her motion is better.  She is no longer using an assistive device.  She continues to ice her right knee.  She has initiated physical therapy.  Review of Systems: No fevers or chills No numbness or tingling No Chest Pain No shortness of breath   Objective: LMP 02/13/2005 (Approximate)   Physical Exam:  Alert and oriented.  No acute distress  Right sided antalgic gait, without assistive device.  Right knee with a mild effusion overall.  Surgical incisions are healing.  No surrounding erythema or drainage.  Range of motion from 5-95 degrees.  Mild tenderness to palpation over the lateral joint line.  IMAGING: I personally ordered and reviewed the following images:  No new imaging obtained today  Oliver Barre, MD 05/16/2023 9:33 AM

## 2023-05-18 DIAGNOSIS — E1165 Type 2 diabetes mellitus with hyperglycemia: Secondary | ICD-10-CM | POA: Diagnosis not present

## 2023-05-22 DIAGNOSIS — M25661 Stiffness of right knee, not elsewhere classified: Secondary | ICD-10-CM | POA: Diagnosis not present

## 2023-05-22 DIAGNOSIS — M6281 Muscle weakness (generalized): Secondary | ICD-10-CM | POA: Diagnosis not present

## 2023-05-22 DIAGNOSIS — M25561 Pain in right knee: Secondary | ICD-10-CM | POA: Diagnosis not present

## 2023-05-22 DIAGNOSIS — R2689 Other abnormalities of gait and mobility: Secondary | ICD-10-CM | POA: Diagnosis not present

## 2023-05-23 DIAGNOSIS — R2689 Other abnormalities of gait and mobility: Secondary | ICD-10-CM | POA: Diagnosis not present

## 2023-05-23 DIAGNOSIS — M25561 Pain in right knee: Secondary | ICD-10-CM | POA: Diagnosis not present

## 2023-05-23 DIAGNOSIS — M25661 Stiffness of right knee, not elsewhere classified: Secondary | ICD-10-CM | POA: Diagnosis not present

## 2023-05-23 DIAGNOSIS — M6281 Muscle weakness (generalized): Secondary | ICD-10-CM | POA: Diagnosis not present

## 2023-05-28 DIAGNOSIS — M25561 Pain in right knee: Secondary | ICD-10-CM | POA: Diagnosis not present

## 2023-05-28 DIAGNOSIS — M6281 Muscle weakness (generalized): Secondary | ICD-10-CM | POA: Diagnosis not present

## 2023-05-28 DIAGNOSIS — R2689 Other abnormalities of gait and mobility: Secondary | ICD-10-CM | POA: Diagnosis not present

## 2023-05-28 DIAGNOSIS — M25661 Stiffness of right knee, not elsewhere classified: Secondary | ICD-10-CM | POA: Diagnosis not present

## 2023-05-29 ENCOUNTER — Encounter (HOSPITAL_COMMUNITY): Payer: Self-pay

## 2023-05-29 ENCOUNTER — Ambulatory Visit (HOSPITAL_COMMUNITY)

## 2023-05-29 NOTE — Therapy (Signed)
 [facility] at [facility] [address] [place], [place], [address] Phone: [phone]   Fax:  [phone]  Patient Details  Name: Sabrina Thompson MRN: [id number] Date of Birth: 1955-09-06 Referring Provider:  No ref. provider found  Encounter Date: 05/29/2023  Pt was called reguarding her missed appointment at 0845 AM. Pt reports she meant to call and cancel initial evaluation since she was able to get into [facility] 3 weeks ago.   Armond Bertin, PT, DPT [facility] Office: [phone] 9:15 AM, 05/29/23   [facility] at [facility] [address] [place], [place], [address] Phone: [phone]   Fax:  [phone]

## 2023-06-04 DIAGNOSIS — M25561 Pain in right knee: Secondary | ICD-10-CM | POA: Diagnosis not present

## 2023-06-04 DIAGNOSIS — M25661 Stiffness of right knee, not elsewhere classified: Secondary | ICD-10-CM | POA: Diagnosis not present

## 2023-06-04 DIAGNOSIS — M6281 Muscle weakness (generalized): Secondary | ICD-10-CM | POA: Diagnosis not present

## 2023-06-04 DIAGNOSIS — R2689 Other abnormalities of gait and mobility: Secondary | ICD-10-CM | POA: Diagnosis not present

## 2023-06-07 ENCOUNTER — Ambulatory Visit: Payer: 59 | Admitting: Podiatry

## 2023-06-08 DIAGNOSIS — M6281 Muscle weakness (generalized): Secondary | ICD-10-CM | POA: Diagnosis not present

## 2023-06-08 DIAGNOSIS — M25661 Stiffness of right knee, not elsewhere classified: Secondary | ICD-10-CM | POA: Diagnosis not present

## 2023-06-08 DIAGNOSIS — R2689 Other abnormalities of gait and mobility: Secondary | ICD-10-CM | POA: Diagnosis not present

## 2023-06-08 DIAGNOSIS — M25561 Pain in right knee: Secondary | ICD-10-CM | POA: Diagnosis not present

## 2023-06-17 DIAGNOSIS — E1165 Type 2 diabetes mellitus with hyperglycemia: Secondary | ICD-10-CM | POA: Diagnosis not present

## 2023-06-27 ENCOUNTER — Ambulatory Visit: Admitting: Orthopedic Surgery

## 2023-06-27 ENCOUNTER — Encounter: Payer: Self-pay | Admitting: Orthopedic Surgery

## 2023-06-27 DIAGNOSIS — S83271D Complex tear of lateral meniscus, current injury, right knee, subsequent encounter: Secondary | ICD-10-CM

## 2023-06-27 NOTE — Progress Notes (Signed)
 Orthopaedic Postop Note  Assessment: Sabrina Thompson is a 68 y.o. female s/p right knee arthroscopy, with partial lateral meniscectomy  DOS: 04/09/2023  Plan: [name] has improved.  She has less pain in the right knee.  Limited swelling.  She has worked with physical therapy and is now doing home exercises.  We discussed the procedure, and expected recovery.  She is comfortable with that improvements thus far.  She will continue to work to get stronger.  We briefly discussed right reverse shoulder arthroplasty.  For now, her shoulder feels okay.  Her motion is decent.  We both agreed that she should focus on her knee for now.  If she is ready to discuss this in more detail, I am happy to see her in clinic at any time.  Follow-up: Return if symptoms worsen or fail to improve. XR at next visit: None  Subjective:  Chief Complaint  Patient presents with   Routine Post Op    R KA DOS: 04/09/2023    History of Present Illness: Sabrina Thompson is a 68 y.o. female who presents following the above stated procedure.  Surgery was approximately 10 weeks ago.  She is better.  She is ambulating without assistive device.  She has worked with therapy, and is not doing exercises on her own.  She plans to go to the [facility], to continue getting stronger.  She is not taking medicines on a regular basis.   Review of Systems: No fevers or chills No numbness or tingling No Chest Pain No shortness of breath   Objective: LMP 02/13/2005 (Approximate)   Physical Exam:  Alert and oriented.  No acute distress  Right sided antalgic gait, without assistive device.  Right knee without effusion.  Surgical incisions are healed.  No surrounding erythema or drainage.  Minimal tenderness.  Valgus alignment overall.  Range of motion 3-110.   IMAGING: I personally ordered and reviewed the following images:  No new imaging obtained today  Tonita Frater, MD 06/27/2023 9:11 AM

## 2023-07-18 DIAGNOSIS — E1165 Type 2 diabetes mellitus with hyperglycemia: Secondary | ICD-10-CM | POA: Diagnosis not present

## 2023-07-20 DIAGNOSIS — M25561 Pain in right knee: Secondary | ICD-10-CM | POA: Diagnosis not present

## 2023-07-20 DIAGNOSIS — R2689 Other abnormalities of gait and mobility: Secondary | ICD-10-CM | POA: Diagnosis not present

## 2023-07-20 DIAGNOSIS — M6281 Muscle weakness (generalized): Secondary | ICD-10-CM | POA: Diagnosis not present

## 2023-07-20 DIAGNOSIS — M25661 Stiffness of right knee, not elsewhere classified: Secondary | ICD-10-CM | POA: Diagnosis not present

## 2023-08-17 DIAGNOSIS — E1165 Type 2 diabetes mellitus with hyperglycemia: Secondary | ICD-10-CM | POA: Diagnosis not present

## 2023-09-12 DIAGNOSIS — E871 Hypo-osmolality and hyponatremia: Secondary | ICD-10-CM | POA: Diagnosis not present

## 2023-09-12 DIAGNOSIS — E1165 Type 2 diabetes mellitus with hyperglycemia: Secondary | ICD-10-CM | POA: Diagnosis not present

## 2023-09-12 DIAGNOSIS — I10 Essential (primary) hypertension: Secondary | ICD-10-CM | POA: Diagnosis not present

## 2023-09-17 DIAGNOSIS — E1165 Type 2 diabetes mellitus with hyperglycemia: Secondary | ICD-10-CM | POA: Diagnosis not present

## 2023-09-19 ENCOUNTER — Other Ambulatory Visit (HOSPITAL_COMMUNITY): Payer: Self-pay | Admitting: Nurse Practitioner

## 2023-09-19 DIAGNOSIS — N3281 Overactive bladder: Secondary | ICD-10-CM | POA: Diagnosis not present

## 2023-09-19 DIAGNOSIS — E782 Mixed hyperlipidemia: Secondary | ICD-10-CM | POA: Diagnosis not present

## 2023-09-19 DIAGNOSIS — L682 Localized hypertrichosis: Secondary | ICD-10-CM | POA: Diagnosis not present

## 2023-09-19 DIAGNOSIS — E871 Hypo-osmolality and hyponatremia: Secondary | ICD-10-CM | POA: Diagnosis not present

## 2023-09-19 DIAGNOSIS — E1165 Type 2 diabetes mellitus with hyperglycemia: Secondary | ICD-10-CM | POA: Diagnosis not present

## 2023-09-19 DIAGNOSIS — D649 Anemia, unspecified: Secondary | ICD-10-CM | POA: Diagnosis not present

## 2023-09-19 DIAGNOSIS — I10 Essential (primary) hypertension: Secondary | ICD-10-CM | POA: Diagnosis not present

## 2023-09-19 DIAGNOSIS — Z1231 Encounter for screening mammogram for malignant neoplasm of breast: Secondary | ICD-10-CM

## 2023-09-19 DIAGNOSIS — L400 Psoriasis vulgaris: Secondary | ICD-10-CM | POA: Insufficient documentation

## 2023-09-19 DIAGNOSIS — M21372 Foot drop, left foot: Secondary | ICD-10-CM | POA: Diagnosis not present

## 2023-09-19 DIAGNOSIS — J309 Allergic rhinitis, unspecified: Secondary | ICD-10-CM | POA: Diagnosis not present

## 2023-09-26 ENCOUNTER — Ambulatory Visit (HOSPITAL_COMMUNITY)
Admission: RE | Admit: 2023-09-26 | Discharge: 2023-09-26 | Disposition: A | Discharge status: Home / Self Care | Source: Ambulatory Visit | Attending: Nurse Practitioner | Admitting: Nurse Practitioner

## 2023-09-26 DIAGNOSIS — Z1231 Encounter for screening mammogram for malignant neoplasm of breast: Secondary | ICD-10-CM | POA: Diagnosis not present

## 2023-10-18 DIAGNOSIS — E1165 Type 2 diabetes mellitus with hyperglycemia: Secondary | ICD-10-CM | POA: Diagnosis not present

## 2023-10-30 ENCOUNTER — Encounter: Payer: Self-pay | Admitting: Orthopedic Surgery

## 2023-10-30 ENCOUNTER — Ambulatory Visit (INDEPENDENT_AMBULATORY_CARE_PROVIDER_SITE_OTHER): Admitting: Orthopedic Surgery

## 2023-10-30 VITALS — Ht 65.0 in | Wt 195.0 lb

## 2023-10-30 DIAGNOSIS — M21372 Foot drop, left foot: Secondary | ICD-10-CM

## 2023-10-30 DIAGNOSIS — S83271D Complex tear of lateral meniscus, current injury, right knee, subsequent encounter: Secondary | ICD-10-CM | POA: Diagnosis not present

## 2023-10-30 NOTE — Patient Instructions (Signed)

## 2023-10-31 NOTE — Progress Notes (Signed)
 Orthopaedic Postop Note  Assessment: Sabrina Thompson is a 68 y.o. female s/p right knee arthroscopy, with partial lateral meniscectomy  DOS: 04/09/2023  Plan: [name] is doing okay following surgery, but does continue to have some pain in the right knee.  She is complaining of pain in the medial and lateral aspect of the knee.  She has worked with therapy.  She is trying to remain active.  She is interested in considering right reverse shoulder arthroplasty, which she was previously scheduled for.  Unfortunately, her A1c remains elevated.  I stressed the importance of this lab value.  She states understanding.  She will continue to strengthen the right leg, walking is much as possible.  She will continue with exercises that she was doing with therapy.  I will see her in 2 months, at which time we can discuss both the right knee, as well as possible shoulder surgery  She was also noted to have a left foot drop.  She previously had an AFO, but this is worn out.  She is interested in a new one.  We provided an order.  Follow-up: Return in about 2 months (around 12/30/2023). XR at next visit: None  Subjective:  Chief Complaint  Patient presents with   Knee Pain    Right/ not happy with it c/o  some pain minimal states not bending as well as she would like, went to 2 visits of PT States has to catch balance before she moves     History of Present Illness: Sabrina Thompson is a 68 y.o. female who presents following the above stated procedure.  Surgery was approximately 7 months ago.  She notes some improvement in her symptoms, but does continue to have pain.  She is not using an assistive device.  She is also interested in discussing right reverse shoulder arthroplasty.  Right knee with   Review of Systems: No fevers or chills No numbness or tingling No Chest Pain No shortness of breath   Objective: Ht 5' 5 (1.651 m)   Wt 195 lb (88.5 kg)   LMP 02/13/2005 (Approximate)   BMI  32.45 kg/m   Physical Exam:  Alert and oriented.  No acute distress  Right sided antalgic gait, without assistive device.  High steppage gait on the left.  Foot drop.  Right knee without obvious swelling.  No increased laxity varus valgus stress.  Negative Lachman.  Tenderness palpation along the medial lateral joint line.  There is atrophy of the right quadriceps compared to the left.   IMAGING: I personally ordered and reviewed the following images:  No new imaging obtained today  Oneil DELENA Horde, MD 10/31/2023 8:46 PM

## 2023-11-17 DIAGNOSIS — E1165 Type 2 diabetes mellitus with hyperglycemia: Secondary | ICD-10-CM | POA: Diagnosis not present

## 2023-12-03 ENCOUNTER — Encounter: Payer: Self-pay | Admitting: Urology

## 2023-12-03 ENCOUNTER — Ambulatory Visit (INDEPENDENT_AMBULATORY_CARE_PROVIDER_SITE_OTHER): Admitting: Urology

## 2023-12-03 VITALS — BP 124/83 | HR 97

## 2023-12-03 DIAGNOSIS — N3281 Overactive bladder: Secondary | ICD-10-CM

## 2023-12-03 LAB — URINALYSIS, ROUTINE W REFLEX MICROSCOPIC
Bilirubin, UA: NEGATIVE
Ketones, UA: NEGATIVE
Nitrite, UA: NEGATIVE
Protein,UA: NEGATIVE
RBC, UA: NEGATIVE
Specific Gravity, UA: 1.01 (ref 1.005–1.030)
Urobilinogen, Ur: 1 mg/dL (ref 0.2–1.0)
pH, UA: 6 (ref 5.0–7.5)

## 2023-12-03 LAB — MICROSCOPIC EXAMINATION: Epithelial Cells (non renal): >10 /HPF — AB (ref 0–10)

## 2023-12-03 MED ORDER — SOLIFENACIN SUCCINATE 5 MG PO TABS: 5.0000 mg | ORAL_TABLET | Freq: Every day | ORAL | 3 refills | Status: AC

## 2023-12-03 MED ORDER — ESTRADIOL 0.01 % VA CREA: TOPICAL_CREAM | VAGINAL | 11 refills | Status: AC

## 2023-12-03 NOTE — Progress Notes (Unsigned)
 12/03/2023 10:56 AM   Sabrina Thompson 06/24/1955 [id number]  Referring provider: Joeann Browning, FNP [address] [place],  [place] [address]  No chief complaint on file.   HPI:  New patient-  1) urinary frequency and urgency-she complains of urinary frequency, urgency, nocturia. Good stream. No dysuria or gross hematuria but gets urine odor.  She has urge incontinence. Wears diaper. Can lose whole bladder. Patient taking Gemtesa 75 mg. No significant improvement. No pelvic surgery or hx. 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 No breast cancer. No bulge.   February 2025 creatinine was 0.75.  UA is clear.  No hematuria.   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    Surgical History: Past Surgical History:  Procedure Laterality Date   BACK SURGERY     lumbar surgery   BACK SURGERY  1998   CHOLECYSTECTOMY  2005   [facility]_Dr Mavis   CHOLECYSTECTOMY  1987   DILATION AND CURETTAGE OF UTERUS  1981   dnc  1981   HERNIA REPAIR     umbilical   HERNIA REPAIR  2003   umbilical    INCISIONAL HERNIA REPAIR  09/22/2011   Procedure: HERNIA REPAIR INCISIONAL;  Surgeon: Oneil DELENA Mavis, MD;  Location: [facility];  Service: General;  Laterality: N/A;   KNEE ARTHROSCOPY Right 04/09/2023   Procedure: ARTHROSCOPY KNEE WITH PARTIAL LATERAL MENISCECTOMY;  Surgeon: Onesimo Oneil DELENA, MD;  Location: [facility];  Service: Orthopedics;  Laterality: Right;  Right knee arthroscopy with partial lateral meniscectomy.   SPINE SURGERY  1998   disc injury   TUBAL LIGATION      Home Medications:  Allergies as of 12/03/2023       Reactions   Flonase [fluticasone] Hives   Nose bleeding    Prednisone Hives   Also causes confusion, can tolerate injection   Prednisone Other (See Comments)   Disoriented and hives         Medication List        Accurate as of December 03, 2023 10:56 AM. If you have any questions, ask your nurse or doctor.          acetaminophen  650 MG CR tablet Commonly known as: TYLENOL  Take 1,300 mg by mouth every 8 (eight) hours as needed for pain.   atorvastatin  10 MG tablet Commonly known as: LIPITOR Take 10 mg by mouth daily.   cyclobenzaprine 10 MG tablet Commonly known as: FLEXERIL Take 10 mg by mouth at bedtime.   FLUoxetine 40 MG capsule Commonly known as: PROZAC Take 40 mg by mouth daily.   gabapentin 300 MG capsule Commonly known as: NEURONTIN Take 300 mg by mouth daily as needed (Muscle spasm / pain).   Gemtesa 75 MG Tabs Generic drug: Vibegron Take 75 mg by mouth daily.   glipiZIDE  10 MG 24 hr tablet Commonly known as: GLUCOTROL  XL Take 10 mg by mouth 2 (two) times daily.   glipiZIDE  5 MG tablet Commonly known as: GLUCOTROL  Take 1 tablet (5 mg total) by mouth 2 (two) times daily.   HumaLOG KwikPen 100 UNIT/ML KwikPen Generic drug: insulin  lispro  Inject 2-10 Units into the skin 3 (three) times daily. Sliding scale 140-180=2 units 181-240=3 units 241-300=4 units 301-350=6 units 351-400=8 units Above 400=10 units  check every 30 min   HYDROcodone -acetaminophen  7.5-325 MG tablet Commonly known as: NORCO Take 1 tablet by mouth every 6 (six) hours as needed for severe pain (pain score 7-10).   olmesartan 40 MG tablet Commonly known as: BENICAR Take 40 mg by mouth daily.   OneTouch Verio test strip Generic drug: glucose blood 1 each by Other route as needed.   Ozempic (0.25 or 0.5 MG/DOSE) 2 MG/3ML Sopn Generic drug: Semaglutide(0.25 or 0.5MG /DOS) Inject 0.25mg  weekly for 4 weeks then increase to 0.5mg  weekly   sodium chloride  0.65 % Soln nasal spray Commonly known as: OCEAN Place 1 spray into both  nostrils daily as needed for congestion.   sodium chloride  1 g tablet Take 1 g by mouth daily.   Toujeo Max SoloStar 300 UNIT/ML Solostar Pen Generic drug: insulin  glargine (2 Unit Dial) Inject 32-36 Units into the skin at bedtime.   traZODone 100 MG tablet Commonly known as: DESYREL Take 100 mg by mouth at bedtime.        Allergies:  Allergies  Allergen Reactions   Flonase [Fluticasone] Hives    Nose bleeding   Prednisone Hives    Also causes confusion, can tolerate injection   Prednisone Other (See Comments)    Disoriented and hives     Family History: Family History  Problem Relation Age of Onset   Cancer Other    Diabetes Other    Arthritis Mother    Depression Mother    Diabetes Mother    Heart disease Mother    Miscarriages / [place] Mother    Cancer Father        prostate   Alcohol abuse Father    Arthritis Father    Diabetes Father    Cancer Brother        lung   Drug abuse Daughter        asddicted to pain pills    Social History:  reports that she has quit smoking. Her smoking use included cigarettes. She has never used smokeless tobacco. She reports that she does not currently use alcohol. She reports that she does not use drugs.   Physical Exam: BP 124/83   Pulse 97   LMP 02/13/2005 (Approximate)   Constitutional:  Alert and oriented, No acute distress. HEENT: [place] AT, moist mucus membranes.  Trachea midline, no masses. Cardiovascular: No clubbing, cyanosis, or edema. Respiratory: Normal respiratory effort, no increased work of breathing. GI: Abdomen is soft, nontender, nondistended, no abdominal masses GU: No CVA tenderness Skin: No rashes, bruises or suspicious lesions. Neurologic: Grossly intact, no focal deficits, moving all 4 extremities. Psychiatric: Normal mood and affect.  Laboratory Data: Lab Results  Component Value Date   WBC 5.3 04/06/2023   HGB 11.6 (L) 04/06/2023   HCT 36.5 04/06/2023   MCV 91.5 04/06/2023   PLT 286  04/06/2023    Lab Results  Component Value Date   CREATININE 0.75 04/06/2023    No results found for: PSA  No results found for: TESTOSTERONE  Lab Results  Component Value Date   HGBA1C 8.0 (H) 04/06/2023    Urinalysis    Component Value Date/Time   COLORURINE STRAW (A) 07/22/2021 1605   APPEARANCEUR CLEAR 07/22/2021 1605   LABSPEC 1.006 07/22/2021 1605   PHURINE 6.0 07/22/2021 1605   GLUCOSEU >=500 (A) 07/22/2021 1605   HGBUR NEGATIVE  07/22/2021 1605   BILIRUBINUR NEGATIVE 07/22/2021 1605   KETONESUR NEGATIVE 07/22/2021 1605   PROTEINUR NEGATIVE 07/22/2021 1605   UROBILINOGEN 0.2 07/09/2014 1530   NITRITE NEGATIVE 07/22/2021 1605   LEUKOCYTESUR NEGATIVE 07/22/2021 1605    Lab Results  Component Value Date   BACTERIA NONE SEEN 07/22/2021    Pertinent Imaging: n/a    Assessment & Plan:    1. Overactive bladder (Primary)  Discussed nature r/b to PT, GSM guideline / TVE (risk of vaginal estrogen discussed), combo (dry mouth, constipation among other), PTNS and botox. Start TVE and solifenacin.   - Urinalysis, Routine w reflex microscopic; Future - Urinalysis, Routine w reflex microscopic  No follow-ups on file.  Donnice Brooks, MD  [facility]  [address] [place], [place] [address] [phone]

## 2023-12-07 DIAGNOSIS — M21372 Foot drop, left foot: Secondary | ICD-10-CM | POA: Diagnosis not present

## 2024-01-01 ENCOUNTER — Ambulatory Visit: Admitting: Orthopedic Surgery

## 2024-03-25 ENCOUNTER — Ambulatory Visit: Admitting: Orthopedic Surgery

## 2024-03-31 ENCOUNTER — Ambulatory Visit: Admitting: Urology
